# Patient Record
Sex: Male | Born: 1945 | Race: Black or African American | Hispanic: No | Marital: Married | Smoking: Never smoker
Health system: Southern US, Community
[De-identification: ages and names within clinical notes are randomized; demographics above are authoritative.]

## PROBLEM LIST (undated history)

## (undated) DIAGNOSIS — G1221 Amyotrophic lateral sclerosis: Secondary | ICD-10-CM

## (undated) DIAGNOSIS — Z789 Other specified health status: Secondary | ICD-10-CM

## (undated) HISTORY — PX: OTHER SURGICAL HISTORY: SHX169

---

## 1999-01-08 ENCOUNTER — Encounter: Payer: Self-pay | Admitting: Endocrinology

## 1999-01-08 ENCOUNTER — Ambulatory Visit (HOSPITAL_COMMUNITY): Admission: RE | Admit: 1999-01-08 | Discharge: 1999-01-08 | Payer: Self-pay | Admitting: Endocrinology

## 1999-09-03 ENCOUNTER — Other Ambulatory Visit: Admission: RE | Admit: 1999-09-03 | Discharge: 1999-09-03 | Payer: Self-pay | Admitting: *Deleted

## 1999-10-09 ENCOUNTER — Ambulatory Visit (HOSPITAL_COMMUNITY): Admission: RE | Admit: 1999-10-09 | Discharge: 1999-10-09 | Payer: Self-pay | Admitting: Gastroenterology

## 2000-08-14 ENCOUNTER — Encounter: Admission: RE | Admit: 2000-08-14 | Discharge: 2000-08-14 | Payer: Self-pay | Admitting: *Deleted

## 2000-08-14 ENCOUNTER — Encounter: Payer: Self-pay | Admitting: *Deleted

## 2011-05-14 HISTORY — PX: UPPER GI ENDOSCOPY: SHX6162

## 2012-02-18 DIAGNOSIS — R059 Cough, unspecified: Secondary | ICD-10-CM | POA: Diagnosis not present

## 2012-02-18 DIAGNOSIS — J069 Acute upper respiratory infection, unspecified: Secondary | ICD-10-CM | POA: Diagnosis not present

## 2012-02-18 DIAGNOSIS — R05 Cough: Secondary | ICD-10-CM | POA: Diagnosis not present

## 2012-03-11 DIAGNOSIS — R935 Abnormal findings on diagnostic imaging of other abdominal regions, including retroperitoneum: Secondary | ICD-10-CM | POA: Diagnosis not present

## 2012-03-11 DIAGNOSIS — Z800 Family history of malignant neoplasm of digestive organs: Secondary | ICD-10-CM | POA: Diagnosis not present

## 2012-04-23 DIAGNOSIS — Z0000 Encounter for general adult medical examination without abnormal findings: Secondary | ICD-10-CM | POA: Diagnosis not present

## 2012-04-23 DIAGNOSIS — Z125 Encounter for screening for malignant neoplasm of prostate: Secondary | ICD-10-CM | POA: Diagnosis not present

## 2012-04-23 DIAGNOSIS — Z23 Encounter for immunization: Secondary | ICD-10-CM | POA: Diagnosis not present

## 2012-04-23 DIAGNOSIS — E785 Hyperlipidemia, unspecified: Secondary | ICD-10-CM | POA: Diagnosis not present

## 2013-02-03 ENCOUNTER — Encounter (HOSPITAL_COMMUNITY): Payer: Self-pay | Admitting: Pharmacy Technician

## 2013-02-03 ENCOUNTER — Other Ambulatory Visit: Payer: Self-pay | Admitting: Gastroenterology

## 2013-02-05 ENCOUNTER — Encounter (HOSPITAL_COMMUNITY): Payer: Self-pay | Admitting: *Deleted

## 2013-02-16 ENCOUNTER — Encounter (HOSPITAL_COMMUNITY): Payer: Self-pay

## 2013-02-16 ENCOUNTER — Ambulatory Visit (HOSPITAL_COMMUNITY): Payer: Medicare Other | Admitting: Anesthesiology

## 2013-02-16 ENCOUNTER — Encounter (HOSPITAL_COMMUNITY)
Admission: RE | Disposition: A | Discharge status: Home / Self Care | Payer: Self-pay | Source: Ambulatory Visit | Attending: Gastroenterology

## 2013-02-16 ENCOUNTER — Ambulatory Visit (HOSPITAL_COMMUNITY)
Admission: RE | Admit: 2013-02-16 | Discharge: 2013-02-16 | Disposition: A | Discharge status: Home / Self Care | Payer: Medicare Other | Source: Ambulatory Visit | Attending: Gastroenterology | Admitting: Gastroenterology

## 2013-02-16 ENCOUNTER — Encounter (HOSPITAL_COMMUNITY): Payer: Self-pay | Admitting: Anesthesiology

## 2013-02-16 DIAGNOSIS — Z86010 Personal history of colonic polyps: Secondary | ICD-10-CM | POA: Diagnosis not present

## 2013-02-16 DIAGNOSIS — D126 Benign neoplasm of colon, unspecified: Secondary | ICD-10-CM | POA: Insufficient documentation

## 2013-02-16 DIAGNOSIS — E7800 Pure hypercholesterolemia, unspecified: Secondary | ICD-10-CM | POA: Diagnosis not present

## 2013-02-16 DIAGNOSIS — N400 Enlarged prostate without lower urinary tract symptoms: Secondary | ICD-10-CM | POA: Insufficient documentation

## 2013-02-16 DIAGNOSIS — Z1211 Encounter for screening for malignant neoplasm of colon: Secondary | ICD-10-CM | POA: Diagnosis not present

## 2013-02-16 DIAGNOSIS — K589 Irritable bowel syndrome without diarrhea: Secondary | ICD-10-CM | POA: Diagnosis not present

## 2013-02-16 HISTORY — PX: COLONOSCOPY WITH PROPOFOL: SHX5780

## 2013-02-16 HISTORY — DX: Other specified health status: Z78.9

## 2013-02-16 SURGERY — COLONOSCOPY WITH PROPOFOL
Anesthesia: Monitor Anesthesia Care

## 2013-02-16 MED ORDER — FENTANYL CITRATE 0.05 MG/ML IJ SOLN
INTRAMUSCULAR | Status: DC | PRN
  Administered 2013-02-16 (×2): 50 ug via INTRAVENOUS

## 2013-02-16 MED ORDER — MIDAZOLAM HCL 5 MG/5ML IJ SOLN
INTRAMUSCULAR | Status: DC | PRN
  Administered 2013-02-16 (×2): 1 mg via INTRAVENOUS

## 2013-02-16 MED ORDER — SODIUM CHLORIDE 0.9 % IV SOLN: INTRAVENOUS | Status: DC

## 2013-02-16 MED ORDER — LACTATED RINGERS IV SOLN
INTRAVENOUS | Status: DC
  Administered 2013-02-16: 11:00:00 via INTRAVENOUS

## 2013-02-16 MED ORDER — KETAMINE HCL 10 MG/ML IJ SOLN
INTRAMUSCULAR | Status: DC | PRN
  Administered 2013-02-16: 10 mg via INTRAVENOUS

## 2013-02-16 MED ORDER — PROPOFOL INFUSION 10 MG/ML OPTIME
INTRAVENOUS | Status: DC | PRN
  Administered 2013-02-16: 140 ug/kg/min via INTRAVENOUS

## 2013-02-16 SURGICAL SUPPLY — 21 items

## 2013-02-16 NOTE — Op Note (Signed)
Procedure: Surveillance colonoscopy  Endoscopist: Danise Edge  Premedication: Propofol administered by anesthesia   Procedure: The patient was placed in the left lateral decubitus position. Anal inspection and digital rectal exam were normal. The Pentax pediatric colonoscope was introduced into the rectum and easily advanced to the cecum. A normal-appearing ileocecal valve and appendiceal orifice were identified. Colonic preparation for the exam today was good.  Rectum. Normal. Retroflex view of the distal rectum normal.  Sigmoid colon and descending colon. Normal.  Splenic flexure. Normal.  Transverse colon. From the mid transverse colon a 5 mm sessile polyp was removed with the cold snare and submitted for pathological interpretation.  Hepatic flexure. Normal.  Ascending colon. Normal.  Cecum and ileocecal valve. Normal.  Assessment  #1. From the mid transverse colon a 5 mm sessile polyp was removed with the cold snare  #2. Otherwise normal surveillance proctocolonoscopy to the cecum  Recommendation: Schedule surveillance colonoscopy in 5 years

## 2013-02-16 NOTE — Transfer of Care (Signed)
Immediate Anesthesia Transfer of Care Note  Patient: Albert Leonard  Procedure(s) Performed: Procedure(s): COLONOSCOPY WITH PROPOFOL (N/A)  Patient Location: PACU  Anesthesia Type:MAC  Level of Consciousness: sedated  Airway & Oxygen Therapy: Patient Spontanous Breathing and Patient connected to face mask oxygen  Post-op Assessment: Report given to PACU RN and Post -op Vital signs reviewed and stable  Post vital signs: Reviewed and stable  Complications: No apparent anesthesia complications

## 2013-02-16 NOTE — Preoperative (Signed)
Beta Blockers   Reason not to administer Beta Blockers:Not Applicable 

## 2013-02-16 NOTE — Anesthesia Postprocedure Evaluation (Signed)
Anesthesia Post Note  Patient: Albert Leonard  Procedure(s) Performed: Procedure(s) (LRB): COLONOSCOPY WITH PROPOFOL (N/A)  Anesthesia type: MAC  Patient location: PACU  Post pain: Pain level controlled  Post assessment: Post-op Vital signs reviewed  Last Vitals: BP 108/74  Temp(Src) 36.5 C (Oral)  Resp 9  Ht 6\' 2"  (1.88 m)  Wt 198 lb (89.812 kg)  BMI 25.41 kg/m2  SpO2 98%  Post vital signs: Reviewed  Level of consciousness: awake  Complications: No apparent anesthesia complications

## 2013-02-16 NOTE — H&P (Signed)
  Procedure: Surveillance colonoscopy. Adenomatous colon polyps removed colonoscopically in the past. Normal surveillance colonoscopy in August 2009.  History: The patient is a 67 year old male born 06/02/1945. The patient is scheduled to undergo a surveillance colonoscopy with polypectomy to prevent colon cancer.  Past medical history: Mastectomy. Irritable bowel syndrome. Benign prostatic hypertrophy. Hypercholesterolemia.  Medication allergies: None  Exam: The patient is alert and lying comfortably on the endoscopy stretcher. Abdomen is soft and nontender to palpation. Lungs are clear to auscultation. Cardiac exam reveals a regular rhythm.  Plan: Proceed with surveillance colonoscopy.

## 2013-02-16 NOTE — Anesthesia Preprocedure Evaluation (Addendum)
Anesthesia Evaluation  Patient identified by MRN, date of birth, ID band Patient awake    Reviewed: Allergy & Precautions, H&P , NPO status , Patient's Chart, lab work & pertinent test results  Airway Mallampati: II TM Distance: >3 FB Neck ROM: Full    Dental  (+) Dental Advisory Given   Pulmonary neg pulmonary ROS,  breath sounds clear to auscultation        Cardiovascular negative cardio ROS  Rhythm:Regular Rate:Normal     Neuro/Psych negative neurological ROS  negative psych ROS   GI/Hepatic negative GI ROS, Neg liver ROS,   Endo/Other  negative endocrine ROS  Renal/GU negative Renal ROS     Musculoskeletal negative musculoskeletal ROS (+)   Abdominal   Peds  Hematology negative hematology ROS (+)   Anesthesia Other Findings   Reproductive/Obstetrics                          Anesthesia Physical Anesthesia Plan  ASA: II  Anesthesia Plan: MAC   Post-op Pain Management:    Induction: Intravenous  Airway Management Planned:   Additional Equipment:   Intra-op Plan:   Post-operative Plan:   Informed Consent: I have reviewed the patients History and Physical, chart, labs and discussed the procedure including the risks, benefits and alternatives for the proposed anesthesia with the patient or authorized representative who has indicated his/her understanding and acceptance.   Dental advisory given  Plan Discussed with: CRNA  Anesthesia Plan Comments:         Anesthesia Quick Evaluation

## 2013-02-17 ENCOUNTER — Encounter (HOSPITAL_COMMUNITY): Payer: Self-pay | Admitting: Gastroenterology

## 2013-04-26 DIAGNOSIS — Z0000 Encounter for general adult medical examination without abnormal findings: Secondary | ICD-10-CM | POA: Diagnosis not present

## 2013-04-26 DIAGNOSIS — E785 Hyperlipidemia, unspecified: Secondary | ICD-10-CM | POA: Diagnosis not present

## 2013-04-26 DIAGNOSIS — N400 Enlarged prostate without lower urinary tract symptoms: Secondary | ICD-10-CM | POA: Diagnosis not present

## 2013-06-03 DIAGNOSIS — N139 Obstructive and reflux uropathy, unspecified: Secondary | ICD-10-CM | POA: Diagnosis not present

## 2013-06-03 DIAGNOSIS — N401 Enlarged prostate with lower urinary tract symptoms: Secondary | ICD-10-CM | POA: Diagnosis not present

## 2013-06-08 DIAGNOSIS — H2510 Age-related nuclear cataract, unspecified eye: Secondary | ICD-10-CM | POA: Diagnosis not present

## 2013-06-08 DIAGNOSIS — H04129 Dry eye syndrome of unspecified lacrimal gland: Secondary | ICD-10-CM | POA: Diagnosis not present

## 2013-08-11 DIAGNOSIS — K2990 Gastroduodenitis, unspecified, without bleeding: Secondary | ICD-10-CM | POA: Diagnosis not present

## 2013-08-11 DIAGNOSIS — K2970 Gastritis, unspecified, without bleeding: Secondary | ICD-10-CM | POA: Diagnosis not present

## 2013-08-11 DIAGNOSIS — R1013 Epigastric pain: Secondary | ICD-10-CM | POA: Diagnosis not present

## 2013-08-11 DIAGNOSIS — Z800 Family history of malignant neoplasm of digestive organs: Secondary | ICD-10-CM | POA: Diagnosis not present

## 2013-08-11 DIAGNOSIS — K861 Other chronic pancreatitis: Secondary | ICD-10-CM | POA: Diagnosis not present

## 2013-08-11 DIAGNOSIS — K3189 Other diseases of stomach and duodenum: Secondary | ICD-10-CM | POA: Diagnosis not present

## 2013-08-11 DIAGNOSIS — Z5181 Encounter for therapeutic drug level monitoring: Secondary | ICD-10-CM | POA: Diagnosis not present

## 2013-08-11 DIAGNOSIS — Z129 Encounter for screening for malignant neoplasm, site unspecified: Secondary | ICD-10-CM | POA: Diagnosis not present

## 2013-09-03 DIAGNOSIS — S61209A Unspecified open wound of unspecified finger without damage to nail, initial encounter: Secondary | ICD-10-CM | POA: Diagnosis not present

## 2013-09-03 DIAGNOSIS — M79609 Pain in unspecified limb: Secondary | ICD-10-CM | POA: Diagnosis not present

## 2013-09-09 DIAGNOSIS — M79609 Pain in unspecified limb: Secondary | ICD-10-CM | POA: Diagnosis not present

## 2013-11-29 ENCOUNTER — Other Ambulatory Visit: Payer: Self-pay | Admitting: Internal Medicine

## 2013-11-29 DIAGNOSIS — R259 Unspecified abnormal involuntary movements: Secondary | ICD-10-CM | POA: Diagnosis not present

## 2013-11-29 DIAGNOSIS — R200 Anesthesia of skin: Secondary | ICD-10-CM

## 2013-11-29 DIAGNOSIS — R29898 Other symptoms and signs involving the musculoskeletal system: Secondary | ICD-10-CM

## 2013-11-29 DIAGNOSIS — M6281 Muscle weakness (generalized): Secondary | ICD-10-CM | POA: Diagnosis not present

## 2013-11-29 DIAGNOSIS — R209 Unspecified disturbances of skin sensation: Secondary | ICD-10-CM | POA: Diagnosis not present

## 2013-11-30 ENCOUNTER — Ambulatory Visit
Admission: RE | Admit: 2013-11-30 | Discharge: 2013-11-30 | Disposition: A | Discharge status: Home / Self Care | Payer: Medicare Other | Source: Ambulatory Visit | Attending: Internal Medicine | Admitting: Internal Medicine

## 2013-11-30 DIAGNOSIS — M47812 Spondylosis without myelopathy or radiculopathy, cervical region: Secondary | ICD-10-CM | POA: Diagnosis not present

## 2013-11-30 DIAGNOSIS — R29898 Other symptoms and signs involving the musculoskeletal system: Secondary | ICD-10-CM

## 2013-11-30 DIAGNOSIS — R200 Anesthesia of skin: Secondary | ICD-10-CM

## 2013-12-07 DIAGNOSIS — G609 Hereditary and idiopathic neuropathy, unspecified: Secondary | ICD-10-CM | POA: Diagnosis not present

## 2013-12-07 DIAGNOSIS — G5600 Carpal tunnel syndrome, unspecified upper limb: Secondary | ICD-10-CM | POA: Diagnosis not present

## 2013-12-09 DIAGNOSIS — M4802 Spinal stenosis, cervical region: Secondary | ICD-10-CM | POA: Diagnosis not present

## 2013-12-09 DIAGNOSIS — M542 Cervicalgia: Secondary | ICD-10-CM | POA: Diagnosis not present

## 2013-12-09 DIAGNOSIS — Z6825 Body mass index (BMI) 25.0-25.9, adult: Secondary | ICD-10-CM | POA: Diagnosis not present

## 2013-12-15 DIAGNOSIS — M4802 Spinal stenosis, cervical region: Secondary | ICD-10-CM | POA: Diagnosis not present

## 2013-12-15 DIAGNOSIS — R293 Abnormal posture: Secondary | ICD-10-CM | POA: Diagnosis not present

## 2013-12-15 DIAGNOSIS — M2550 Pain in unspecified joint: Secondary | ICD-10-CM | POA: Diagnosis not present

## 2013-12-15 DIAGNOSIS — M6281 Muscle weakness (generalized): Secondary | ICD-10-CM | POA: Diagnosis not present

## 2013-12-16 DIAGNOSIS — M4802 Spinal stenosis, cervical region: Secondary | ICD-10-CM | POA: Diagnosis not present

## 2013-12-16 DIAGNOSIS — M6281 Muscle weakness (generalized): Secondary | ICD-10-CM | POA: Diagnosis not present

## 2013-12-16 DIAGNOSIS — M2550 Pain in unspecified joint: Secondary | ICD-10-CM | POA: Diagnosis not present

## 2013-12-16 DIAGNOSIS — R293 Abnormal posture: Secondary | ICD-10-CM | POA: Diagnosis not present

## 2013-12-20 DIAGNOSIS — M4802 Spinal stenosis, cervical region: Secondary | ICD-10-CM | POA: Diagnosis not present

## 2013-12-20 DIAGNOSIS — M2550 Pain in unspecified joint: Secondary | ICD-10-CM | POA: Diagnosis not present

## 2013-12-20 DIAGNOSIS — R293 Abnormal posture: Secondary | ICD-10-CM | POA: Diagnosis not present

## 2013-12-20 DIAGNOSIS — M6281 Muscle weakness (generalized): Secondary | ICD-10-CM | POA: Diagnosis not present

## 2013-12-22 DIAGNOSIS — M2550 Pain in unspecified joint: Secondary | ICD-10-CM | POA: Diagnosis not present

## 2013-12-22 DIAGNOSIS — R293 Abnormal posture: Secondary | ICD-10-CM | POA: Diagnosis not present

## 2013-12-22 DIAGNOSIS — M6281 Muscle weakness (generalized): Secondary | ICD-10-CM | POA: Diagnosis not present

## 2013-12-22 DIAGNOSIS — M4802 Spinal stenosis, cervical region: Secondary | ICD-10-CM | POA: Diagnosis not present

## 2013-12-24 DIAGNOSIS — M2550 Pain in unspecified joint: Secondary | ICD-10-CM | POA: Diagnosis not present

## 2013-12-24 DIAGNOSIS — M4802 Spinal stenosis, cervical region: Secondary | ICD-10-CM | POA: Diagnosis not present

## 2013-12-24 DIAGNOSIS — M6281 Muscle weakness (generalized): Secondary | ICD-10-CM | POA: Diagnosis not present

## 2013-12-24 DIAGNOSIS — R293 Abnormal posture: Secondary | ICD-10-CM | POA: Diagnosis not present

## 2013-12-27 DIAGNOSIS — M6281 Muscle weakness (generalized): Secondary | ICD-10-CM | POA: Diagnosis not present

## 2013-12-27 DIAGNOSIS — M4802 Spinal stenosis, cervical region: Secondary | ICD-10-CM | POA: Diagnosis not present

## 2013-12-27 DIAGNOSIS — R293 Abnormal posture: Secondary | ICD-10-CM | POA: Diagnosis not present

## 2013-12-27 DIAGNOSIS — M2550 Pain in unspecified joint: Secondary | ICD-10-CM | POA: Diagnosis not present

## 2013-12-29 DIAGNOSIS — M6281 Muscle weakness (generalized): Secondary | ICD-10-CM | POA: Diagnosis not present

## 2013-12-29 DIAGNOSIS — M4802 Spinal stenosis, cervical region: Secondary | ICD-10-CM | POA: Diagnosis not present

## 2013-12-29 DIAGNOSIS — R293 Abnormal posture: Secondary | ICD-10-CM | POA: Diagnosis not present

## 2013-12-29 DIAGNOSIS — M2550 Pain in unspecified joint: Secondary | ICD-10-CM | POA: Diagnosis not present

## 2013-12-31 DIAGNOSIS — M4802 Spinal stenosis, cervical region: Secondary | ICD-10-CM | POA: Diagnosis not present

## 2013-12-31 DIAGNOSIS — R293 Abnormal posture: Secondary | ICD-10-CM | POA: Diagnosis not present

## 2013-12-31 DIAGNOSIS — M6281 Muscle weakness (generalized): Secondary | ICD-10-CM | POA: Diagnosis not present

## 2013-12-31 DIAGNOSIS — M2550 Pain in unspecified joint: Secondary | ICD-10-CM | POA: Diagnosis not present

## 2014-01-03 DIAGNOSIS — M4802 Spinal stenosis, cervical region: Secondary | ICD-10-CM | POA: Diagnosis not present

## 2014-01-03 DIAGNOSIS — M4712 Other spondylosis with myelopathy, cervical region: Secondary | ICD-10-CM | POA: Diagnosis not present

## 2014-01-03 DIAGNOSIS — M6281 Muscle weakness (generalized): Secondary | ICD-10-CM | POA: Diagnosis not present

## 2014-01-03 DIAGNOSIS — M2550 Pain in unspecified joint: Secondary | ICD-10-CM | POA: Diagnosis not present

## 2014-01-03 DIAGNOSIS — R293 Abnormal posture: Secondary | ICD-10-CM | POA: Diagnosis not present

## 2014-01-05 DIAGNOSIS — R293 Abnormal posture: Secondary | ICD-10-CM | POA: Diagnosis not present

## 2014-01-05 DIAGNOSIS — M2550 Pain in unspecified joint: Secondary | ICD-10-CM | POA: Diagnosis not present

## 2014-01-05 DIAGNOSIS — M4802 Spinal stenosis, cervical region: Secondary | ICD-10-CM | POA: Diagnosis not present

## 2014-01-05 DIAGNOSIS — M6281 Muscle weakness (generalized): Secondary | ICD-10-CM | POA: Diagnosis not present

## 2014-01-07 DIAGNOSIS — R293 Abnormal posture: Secondary | ICD-10-CM | POA: Diagnosis not present

## 2014-01-07 DIAGNOSIS — M2550 Pain in unspecified joint: Secondary | ICD-10-CM | POA: Diagnosis not present

## 2014-01-07 DIAGNOSIS — M6281 Muscle weakness (generalized): Secondary | ICD-10-CM | POA: Diagnosis not present

## 2014-01-07 DIAGNOSIS — M4802 Spinal stenosis, cervical region: Secondary | ICD-10-CM | POA: Diagnosis not present

## 2014-01-10 DIAGNOSIS — M2550 Pain in unspecified joint: Secondary | ICD-10-CM | POA: Diagnosis not present

## 2014-01-10 DIAGNOSIS — R293 Abnormal posture: Secondary | ICD-10-CM | POA: Diagnosis not present

## 2014-01-10 DIAGNOSIS — M6281 Muscle weakness (generalized): Secondary | ICD-10-CM | POA: Diagnosis not present

## 2014-01-10 DIAGNOSIS — M4802 Spinal stenosis, cervical region: Secondary | ICD-10-CM | POA: Diagnosis not present

## 2014-01-11 DIAGNOSIS — R293 Abnormal posture: Secondary | ICD-10-CM | POA: Diagnosis not present

## 2014-01-11 DIAGNOSIS — M6281 Muscle weakness (generalized): Secondary | ICD-10-CM | POA: Diagnosis not present

## 2014-01-11 DIAGNOSIS — M2550 Pain in unspecified joint: Secondary | ICD-10-CM | POA: Diagnosis not present

## 2014-01-11 DIAGNOSIS — M4802 Spinal stenosis, cervical region: Secondary | ICD-10-CM | POA: Diagnosis not present

## 2014-01-19 DIAGNOSIS — M2550 Pain in unspecified joint: Secondary | ICD-10-CM | POA: Diagnosis not present

## 2014-01-19 DIAGNOSIS — M6281 Muscle weakness (generalized): Secondary | ICD-10-CM | POA: Diagnosis not present

## 2014-01-19 DIAGNOSIS — R293 Abnormal posture: Secondary | ICD-10-CM | POA: Diagnosis not present

## 2014-01-19 DIAGNOSIS — M4802 Spinal stenosis, cervical region: Secondary | ICD-10-CM | POA: Diagnosis not present

## 2014-01-21 DIAGNOSIS — M6281 Muscle weakness (generalized): Secondary | ICD-10-CM | POA: Diagnosis not present

## 2014-01-21 DIAGNOSIS — M4802 Spinal stenosis, cervical region: Secondary | ICD-10-CM | POA: Diagnosis not present

## 2014-01-21 DIAGNOSIS — R293 Abnormal posture: Secondary | ICD-10-CM | POA: Diagnosis not present

## 2014-01-21 DIAGNOSIS — M2550 Pain in unspecified joint: Secondary | ICD-10-CM | POA: Diagnosis not present

## 2014-01-24 DIAGNOSIS — M4802 Spinal stenosis, cervical region: Secondary | ICD-10-CM | POA: Diagnosis not present

## 2014-01-24 DIAGNOSIS — G5600 Carpal tunnel syndrome, unspecified upper limb: Secondary | ICD-10-CM | POA: Diagnosis not present

## 2014-01-24 DIAGNOSIS — M47812 Spondylosis without myelopathy or radiculopathy, cervical region: Secondary | ICD-10-CM | POA: Diagnosis not present

## 2014-01-24 DIAGNOSIS — M5030 Other cervical disc degeneration, unspecified cervical region: Secondary | ICD-10-CM | POA: Diagnosis not present

## 2014-01-26 DIAGNOSIS — M2550 Pain in unspecified joint: Secondary | ICD-10-CM | POA: Diagnosis not present

## 2014-01-26 DIAGNOSIS — M6281 Muscle weakness (generalized): Secondary | ICD-10-CM | POA: Diagnosis not present

## 2014-01-26 DIAGNOSIS — R293 Abnormal posture: Secondary | ICD-10-CM | POA: Diagnosis not present

## 2014-01-26 DIAGNOSIS — M4802 Spinal stenosis, cervical region: Secondary | ICD-10-CM | POA: Diagnosis not present

## 2014-01-28 DIAGNOSIS — M6281 Muscle weakness (generalized): Secondary | ICD-10-CM | POA: Diagnosis not present

## 2014-01-28 DIAGNOSIS — M2550 Pain in unspecified joint: Secondary | ICD-10-CM | POA: Diagnosis not present

## 2014-01-28 DIAGNOSIS — R293 Abnormal posture: Secondary | ICD-10-CM | POA: Diagnosis not present

## 2014-01-28 DIAGNOSIS — M4802 Spinal stenosis, cervical region: Secondary | ICD-10-CM | POA: Diagnosis not present

## 2014-01-31 DIAGNOSIS — M4802 Spinal stenosis, cervical region: Secondary | ICD-10-CM | POA: Diagnosis not present

## 2014-01-31 DIAGNOSIS — M2550 Pain in unspecified joint: Secondary | ICD-10-CM | POA: Diagnosis not present

## 2014-01-31 DIAGNOSIS — R293 Abnormal posture: Secondary | ICD-10-CM | POA: Diagnosis not present

## 2014-01-31 DIAGNOSIS — M6281 Muscle weakness (generalized): Secondary | ICD-10-CM | POA: Diagnosis not present

## 2014-03-07 DIAGNOSIS — M47812 Spondylosis without myelopathy or radiculopathy, cervical region: Secondary | ICD-10-CM | POA: Diagnosis not present

## 2014-03-07 DIAGNOSIS — G5602 Carpal tunnel syndrome, left upper limb: Secondary | ICD-10-CM | POA: Diagnosis not present

## 2014-03-07 DIAGNOSIS — E780 Pure hypercholesterolemia: Secondary | ICD-10-CM | POA: Diagnosis not present

## 2014-03-07 DIAGNOSIS — G5601 Carpal tunnel syndrome, right upper limb: Secondary | ICD-10-CM | POA: Diagnosis not present

## 2014-03-07 DIAGNOSIS — M47892 Other spondylosis, cervical region: Secondary | ICD-10-CM | POA: Diagnosis not present

## 2014-03-07 DIAGNOSIS — R531 Weakness: Secondary | ICD-10-CM | POA: Diagnosis not present

## 2014-03-07 DIAGNOSIS — R29898 Other symptoms and signs involving the musculoskeletal system: Secondary | ICD-10-CM | POA: Diagnosis not present

## 2014-03-07 DIAGNOSIS — M6259 Muscle wasting and atrophy, not elsewhere classified, multiple sites: Secondary | ICD-10-CM | POA: Diagnosis not present

## 2014-03-07 DIAGNOSIS — M62529 Muscle wasting and atrophy, not elsewhere classified, unspecified upper arm: Secondary | ICD-10-CM | POA: Diagnosis not present

## 2014-04-04 DIAGNOSIS — G603 Idiopathic progressive neuropathy: Secondary | ICD-10-CM | POA: Diagnosis not present

## 2014-04-04 DIAGNOSIS — R531 Weakness: Secondary | ICD-10-CM | POA: Diagnosis not present

## 2014-04-04 DIAGNOSIS — M479 Spondylosis, unspecified: Secondary | ICD-10-CM | POA: Diagnosis not present

## 2014-04-04 DIAGNOSIS — M47812 Spondylosis without myelopathy or radiculopathy, cervical region: Secondary | ICD-10-CM | POA: Diagnosis not present

## 2014-04-14 DIAGNOSIS — G1220 Motor neuron disease, unspecified: Secondary | ICD-10-CM | POA: Diagnosis not present

## 2014-04-14 DIAGNOSIS — G5601 Carpal tunnel syndrome, right upper limb: Secondary | ICD-10-CM | POA: Diagnosis not present

## 2014-04-14 DIAGNOSIS — R531 Weakness: Secondary | ICD-10-CM | POA: Diagnosis not present

## 2014-05-02 DIAGNOSIS — Z1389 Encounter for screening for other disorder: Secondary | ICD-10-CM | POA: Diagnosis not present

## 2014-05-02 DIAGNOSIS — Z125 Encounter for screening for malignant neoplasm of prostate: Secondary | ICD-10-CM | POA: Diagnosis not present

## 2014-05-02 DIAGNOSIS — M6281 Muscle weakness (generalized): Secondary | ICD-10-CM | POA: Diagnosis not present

## 2014-05-02 DIAGNOSIS — Z0001 Encounter for general adult medical examination with abnormal findings: Secondary | ICD-10-CM | POA: Diagnosis not present

## 2014-05-02 DIAGNOSIS — E785 Hyperlipidemia, unspecified: Secondary | ICD-10-CM | POA: Diagnosis not present

## 2014-05-19 DIAGNOSIS — G1221 Amyotrophic lateral sclerosis: Secondary | ICD-10-CM | POA: Diagnosis not present

## 2014-05-25 DIAGNOSIS — G1221 Amyotrophic lateral sclerosis: Secondary | ICD-10-CM | POA: Diagnosis not present

## 2014-05-25 DIAGNOSIS — Z5189 Encounter for other specified aftercare: Secondary | ICD-10-CM | POA: Diagnosis not present

## 2014-05-25 DIAGNOSIS — R531 Weakness: Secondary | ICD-10-CM | POA: Diagnosis not present

## 2014-06-13 DIAGNOSIS — Z79899 Other long term (current) drug therapy: Secondary | ICD-10-CM | POA: Diagnosis not present

## 2014-08-18 DIAGNOSIS — Z125 Encounter for screening for malignant neoplasm of prostate: Secondary | ICD-10-CM | POA: Diagnosis not present

## 2014-08-22 DIAGNOSIS — N401 Enlarged prostate with lower urinary tract symptoms: Secondary | ICD-10-CM | POA: Diagnosis not present

## 2014-08-22 DIAGNOSIS — N138 Other obstructive and reflux uropathy: Secondary | ICD-10-CM | POA: Diagnosis not present

## 2014-08-22 DIAGNOSIS — Z125 Encounter for screening for malignant neoplasm of prostate: Secondary | ICD-10-CM | POA: Diagnosis not present

## 2014-08-31 DIAGNOSIS — G1221 Amyotrophic lateral sclerosis: Secondary | ICD-10-CM | POA: Diagnosis not present

## 2014-09-07 DIAGNOSIS — Z800 Family history of malignant neoplasm of digestive organs: Secondary | ICD-10-CM | POA: Diagnosis not present

## 2014-12-14 DIAGNOSIS — G1221 Amyotrophic lateral sclerosis: Secondary | ICD-10-CM | POA: Diagnosis not present

## 2014-12-14 DIAGNOSIS — Z79899 Other long term (current) drug therapy: Secondary | ICD-10-CM | POA: Diagnosis not present

## 2015-03-10 DIAGNOSIS — Z79899 Other long term (current) drug therapy: Secondary | ICD-10-CM | POA: Diagnosis not present

## 2015-03-29 DIAGNOSIS — G1221 Amyotrophic lateral sclerosis: Secondary | ICD-10-CM | POA: Diagnosis not present

## 2015-03-29 DIAGNOSIS — R531 Weakness: Secondary | ICD-10-CM | POA: Diagnosis not present

## 2015-06-08 DIAGNOSIS — G1221 Amyotrophic lateral sclerosis: Secondary | ICD-10-CM | POA: Diagnosis not present

## 2015-06-08 DIAGNOSIS — E785 Hyperlipidemia, unspecified: Secondary | ICD-10-CM | POA: Diagnosis not present

## 2015-06-08 DIAGNOSIS — Z1389 Encounter for screening for other disorder: Secondary | ICD-10-CM | POA: Diagnosis not present

## 2015-06-08 DIAGNOSIS — Z0001 Encounter for general adult medical examination with abnormal findings: Secondary | ICD-10-CM | POA: Diagnosis not present

## 2015-06-21 DIAGNOSIS — G1221 Amyotrophic lateral sclerosis: Secondary | ICD-10-CM | POA: Diagnosis not present

## 2015-06-21 DIAGNOSIS — R253 Fasciculation: Secondary | ICD-10-CM | POA: Diagnosis not present

## 2015-06-21 DIAGNOSIS — R94131 Abnormal electromyogram [EMG]: Secondary | ICD-10-CM | POA: Diagnosis not present

## 2015-06-30 DIAGNOSIS — M6281 Muscle weakness (generalized): Secondary | ICD-10-CM | POA: Diagnosis not present

## 2015-07-24 DIAGNOSIS — R509 Fever, unspecified: Secondary | ICD-10-CM | POA: Diagnosis not present

## 2015-07-24 DIAGNOSIS — J189 Pneumonia, unspecified organism: Secondary | ICD-10-CM | POA: Diagnosis not present

## 2015-07-24 DIAGNOSIS — R05 Cough: Secondary | ICD-10-CM | POA: Diagnosis not present

## 2015-08-05 ENCOUNTER — Emergency Department (HOSPITAL_COMMUNITY)
Admission: EM | Admit: 2015-08-05 | Discharge: 2015-08-05 | Disposition: A | Discharge status: Home / Self Care | Payer: Medicare Other | Attending: Emergency Medicine | Admitting: Emergency Medicine

## 2015-08-05 ENCOUNTER — Encounter (HOSPITAL_COMMUNITY): Payer: Self-pay

## 2015-08-05 ENCOUNTER — Emergency Department (HOSPITAL_COMMUNITY): Payer: Medicare Other

## 2015-08-05 DIAGNOSIS — R1084 Generalized abdominal pain: Secondary | ICD-10-CM | POA: Diagnosis present

## 2015-08-05 DIAGNOSIS — Z7982 Long term (current) use of aspirin: Secondary | ICD-10-CM | POA: Insufficient documentation

## 2015-08-05 DIAGNOSIS — Z79899 Other long term (current) drug therapy: Secondary | ICD-10-CM | POA: Insufficient documentation

## 2015-08-05 DIAGNOSIS — R109 Unspecified abdominal pain: Secondary | ICD-10-CM | POA: Diagnosis not present

## 2015-08-05 DIAGNOSIS — Z87891 Personal history of nicotine dependence: Secondary | ICD-10-CM | POA: Insufficient documentation

## 2015-08-05 DIAGNOSIS — R197 Diarrhea, unspecified: Secondary | ICD-10-CM

## 2015-08-05 DIAGNOSIS — R112 Nausea with vomiting, unspecified: Secondary | ICD-10-CM

## 2015-08-05 DIAGNOSIS — K529 Noninfective gastroenteritis and colitis, unspecified: Secondary | ICD-10-CM | POA: Insufficient documentation

## 2015-08-05 DIAGNOSIS — Z8669 Personal history of other diseases of the nervous system and sense organs: Secondary | ICD-10-CM | POA: Diagnosis not present

## 2015-08-05 HISTORY — DX: Amyotrophic lateral sclerosis: G12.21

## 2015-08-05 LAB — URINE MICROSCOPIC-ADD ON
RBC / HPF: NONE SEEN RBC/hpf (ref 0–5)
SQUAMOUS EPITHELIAL / LPF: NONE SEEN

## 2015-08-05 LAB — LIPASE, BLOOD: Lipase: 33 U/L (ref 11–51)

## 2015-08-05 LAB — CBC
HCT: 44.4 % (ref 39.0–52.0)
Hemoglobin: 15.4 g/dL (ref 13.0–17.0)
MCH: 30.8 pg (ref 26.0–34.0)
MCHC: 34.7 g/dL (ref 30.0–36.0)
MCV: 88.8 fL (ref 78.0–100.0)
PLATELETS: 205 10*3/uL (ref 150–400)
RBC: 5 MIL/uL (ref 4.22–5.81)
RDW: 12.8 % (ref 11.5–15.5)
WBC: 12 10*3/uL — AB (ref 4.0–10.5)

## 2015-08-05 LAB — COMPREHENSIVE METABOLIC PANEL
ALT: 63 U/L (ref 17–63)
AST: 62 U/L — AB (ref 15–41)
Albumin: 4.6 g/dL (ref 3.5–5.0)
Alkaline Phosphatase: 50 U/L (ref 38–126)
Anion gap: 11 (ref 5–15)
BILIRUBIN TOTAL: 1.5 mg/dL — AB (ref 0.3–1.2)
BUN: 20 mg/dL (ref 6–20)
CO2: 24 mmol/L (ref 22–32)
CREATININE: 1.01 mg/dL (ref 0.61–1.24)
Calcium: 9.4 mg/dL (ref 8.9–10.3)
Chloride: 105 mmol/L (ref 101–111)
GFR calc Af Amer: >60 mL/min (ref >60)
GFR calc non Af Amer: >60 mL/min (ref >60)
Glucose, Bld: 155 mg/dL — ABNORMAL HIGH (ref 65–99)
Potassium: 4.8 mmol/L (ref 3.5–5.1)
Sodium: 140 mmol/L (ref 135–145)
TOTAL PROTEIN: 9 g/dL — AB (ref 6.5–8.1)

## 2015-08-05 LAB — URINALYSIS, ROUTINE W REFLEX MICROSCOPIC
BILIRUBIN URINE: NEGATIVE
Glucose, UA: NEGATIVE mg/dL
Hgb urine dipstick: NEGATIVE
KETONES UR: NEGATIVE mg/dL
Leukocytes, UA: NEGATIVE
NITRITE: NEGATIVE
PROTEIN: 30 mg/dL — AB
Specific Gravity, Urine: 1.025 (ref 1.005–1.030)
pH: 5 (ref 5.0–8.0)

## 2015-08-05 LAB — I-STAT CHEM 8, ED
BUN: 23 mg/dL — AB (ref 6–20)
CREATININE: 1 mg/dL (ref 0.61–1.24)
Calcium, Ion: 1.07 mmol/L — ABNORMAL LOW (ref 1.13–1.30)
Chloride: 104 mmol/L (ref 101–111)
Glucose, Bld: 152 mg/dL — ABNORMAL HIGH (ref 65–99)
HCT: 51 % (ref 39.0–52.0)
Hemoglobin: 17.3 g/dL — ABNORMAL HIGH (ref 13.0–17.0)
POTASSIUM: 4.6 mmol/L (ref 3.5–5.1)
Sodium: 138 mmol/L (ref 135–145)
TCO2: 22 mmol/L (ref 0–100)

## 2015-08-05 MED ORDER — ONDANSETRON HCL 4 MG/2ML IJ SOLN
4.0000 mg | Freq: Once | INTRAMUSCULAR | Status: AC
  Administered 2015-08-05: 4 mg via INTRAVENOUS
  Filled 2015-08-05: qty 2

## 2015-08-05 MED ORDER — IOPAMIDOL (ISOVUE-300) INJECTION 61%
100.0000 mL | Freq: Once | INTRAVENOUS | Status: AC | PRN
  Administered 2015-08-05: 100 mL via INTRAVENOUS

## 2015-08-05 MED ORDER — IOHEXOL 300 MG/ML  SOLN
25.0000 mL | Freq: Once | INTRAMUSCULAR | Status: AC | PRN
Start: 2015-08-05 — End: 2015-08-05
  Administered 2015-08-05: 25 mL via ORAL

## 2015-08-05 MED ORDER — SODIUM CHLORIDE 0.9 % IV BOLUS (SEPSIS)
1000.0000 mL | Freq: Once | INTRAVENOUS | Status: AC
  Administered 2015-08-05: 1000 mL via INTRAVENOUS

## 2015-08-05 MED ORDER — MORPHINE SULFATE (PF) 4 MG/ML IV SOLN
4.0000 mg | Freq: Once | INTRAVENOUS | Status: AC
  Administered 2015-08-05: 4 mg via INTRAVENOUS
  Filled 2015-08-05: qty 1

## 2015-08-05 NOTE — ED Notes (Signed)
Pt with diarrhea, nausea/vomiting since last night.  Became weak at one point and fell.  Abdominal pain is easing.  No fever but ? Feeling warm

## 2015-08-05 NOTE — Discharge Instructions (Signed)
It was our pleasure to provide your ER care today - we hope that you feel better.  Rest. Drink plenty of fluids.  Follow up with primary care doctor in the next 1-2 days if symptoms fail to improve/resolve.  Return to ER if worse, new symptoms, persistent vomiting, worsening or severe abdominal pain, weak/fainting, fevers, other concern.     Viral Gastroenteritis Viral gastroenteritis is also known as stomach flu. This condition affects the stomach and intestinal tract. It can cause sudden diarrhea and vomiting. The illness typically lasts 3 to 8 days. Most people develop an immune response that eventually gets rid of the virus. While this natural response develops, the virus can make you quite ill. CAUSES  Many different viruses can cause gastroenteritis, such as rotavirus or noroviruses. You can catch one of these viruses by consuming contaminated food or water. You may also catch a virus by sharing utensils or other personal items with an infected person or by touching a contaminated surface. SYMPTOMS  The most common symptoms are diarrhea and vomiting. These problems can cause a severe loss of body fluids (dehydration) and a body salt (electrolyte) imbalance. Other symptoms may include:  Fever.  Headache.  Fatigue.  Abdominal pain. DIAGNOSIS  Your caregiver can usually diagnose viral gastroenteritis based on your symptoms and a physical exam. A stool sample may also be taken to test for the presence of viruses or other infections. TREATMENT  This illness typically goes away on its own. Treatments are aimed at rehydration. The most serious cases of viral gastroenteritis involve vomiting so severely that you are not able to keep fluids down. In these cases, fluids must be given through an intravenous line (IV). HOME CARE INSTRUCTIONS   Drink enough fluids to keep your urine clear or pale yellow. Drink small amounts of fluids frequently and increase the amounts as tolerated.  Ask  your caregiver for specific rehydration instructions.  Avoid:  Foods high in sugar.  Alcohol.  Carbonated drinks.  Tobacco.  Juice.  Caffeine drinks.  Extremely hot or cold fluids.  Fatty, greasy foods.  Too much intake of anything at one time.  Dairy products until 24 to 48 hours after diarrhea stops.  You may consume probiotics. Probiotics are active cultures of beneficial bacteria. They may lessen the amount and number of diarrheal stools in adults. Probiotics can be found in yogurt with active cultures and in supplements.  Wash your hands well to avoid spreading the virus.  Only take over-the-counter or prescription medicines for pain, discomfort, or fever as directed by your caregiver. Do not give aspirin to children. Antidiarrheal medicines are not recommended.  Ask your caregiver if you should continue to take your regular prescribed and over-the-counter medicines.  Keep all follow-up appointments as directed by your caregiver. SEEK IMMEDIATE MEDICAL CARE IF:   You are unable to keep fluids down.  You do not urinate at least once every 6 to 8 hours.  You develop shortness of breath.  You notice blood in your stool or vomit. This may look like coffee grounds.  You have abdominal pain that increases or is concentrated in one small area (localized).  You have persistent vomiting or diarrhea.  You have a fever.  The patient is a child younger than 3 months, and he or she has a fever.  The patient is a child older than 3 months, and he or she has a fever and persistent symptoms.  The patient is a child older than 3 months, and  he or she has a fever and symptoms suddenly get worse.  The patient is a baby, and he or she has no tears when crying. MAKE SURE YOU:   Understand these instructions.  Will watch your condition.  Will get help right away if you are not doing well or get worse.   This information is not intended to replace advice given to you by  your health care provider. Make sure you discuss any questions you have with your health care provider.   Document Released: 04/29/2005 Document Revised: 07/22/2011 Document Reviewed: 02/13/2011 Elsevier Interactive Patient Education 2016 [employer]   Nausea and Vomiting Nausea is a sick feeling that often comes before throwing up (vomiting). Vomiting is a reflex where stomach contents come out of your mouth. Vomiting can cause severe loss of body fluids (dehydration). Children and elderly adults can become dehydrated quickly, especially if they also have diarrhea. Nausea and vomiting are symptoms of a condition or disease. It is important to find the cause of your symptoms. CAUSES   Direct irritation of the stomach lining. This irritation can result from increased acid production (gastroesophageal reflux disease), infection, food poisoning, taking certain medicines (such as nonsteroidal anti-inflammatory drugs), alcohol use, or tobacco use.  Signals from the brain.These signals could be caused by a headache, heat exposure, an inner ear disturbance, increased pressure in the brain from injury, infection, a tumor, or a concussion, pain, emotional stimulus, or metabolic problems.  An obstruction in the gastrointestinal tract (bowel obstruction).  Illnesses such as diabetes, hepatitis, gallbladder problems, appendicitis, kidney problems, cancer, sepsis, atypical symptoms of a heart attack, or eating disorders.  Medical treatments such as chemotherapy and radiation.  Receiving medicine that makes you sleep (general anesthetic) during surgery. DIAGNOSIS Your caregiver may ask for tests to be done if the problems do not improve after a few days. Tests may also be done if symptoms are severe or if the reason for the nausea and vomiting is not clear. Tests may include:  Urine tests.  Blood tests.  Stool tests.  Cultures (to look for evidence of infection).  X-rays or other imaging  studies. Test results can help your caregiver make decisions about treatment or the need for additional tests. TREATMENT You need to stay well hydrated. Drink frequently but in small amounts.You may wish to drink water, sports drinks, clear broth, or eat frozen ice pops or gelatin dessert to help stay hydrated.When you eat, eating slowly may help prevent nausea.There are also some antinausea medicines that may help prevent nausea. HOME CARE INSTRUCTIONS   Take all medicine as directed by your caregiver.  If you do not have an appetite, do not force yourself to eat. However, you must continue to drink fluids.  If you have an appetite, eat a normal diet unless your caregiver tells you differently.  Eat a variety of complex carbohydrates (rice, wheat, potatoes, bread), lean meats, yogurt, fruits, and vegetables.  Avoid high-fat foods because they are more difficult to digest.  Drink enough water and fluids to keep your urine clear or pale yellow.  If you are dehydrated, ask your caregiver for specific rehydration instructions. Signs of dehydration may include:  Severe thirst.  Dry lips and mouth.  Dizziness.  Dark urine.  Decreasing urine frequency and amount.  Confusion.  Rapid breathing or pulse. SEEK IMMEDIATE MEDICAL CARE IF:   You have blood or brown flecks (like coffee grounds) in your vomit.  You have black or bloody stools.  You have a  severe headache or stiff neck.  You are confused.  You have severe abdominal pain.  You have chest pain or trouble breathing.  You do not urinate at least once every 8 hours.  You develop cold or clammy skin.  You continue to vomit for longer than 24 to 48 hours.  You have a fever. MAKE SURE YOU:   Understand these instructions.  Will watch your condition.  Will get help right away if you are not doing well or get worse.   This information is not intended to replace advice given to you by your health care provider.  Make sure you discuss any questions you have with your health care provider.   Document Released: 04/29/2005 Document Revised: 07/22/2011 Document Reviewed: 09/26/2010 Elsevier Interactive Patient Education 2016 [place].  Diarrhea Diarrhea is frequent loose and watery bowel movements. It can cause you to feel weak and dehydrated. Dehydration can cause you to become tired and thirsty, have a dry mouth, and have decreased urination that often is dark yellow. Diarrhea is a sign of another problem, most often an infection that will not last long. In most cases, diarrhea typically lasts 2-3 days. However, it can last longer if it is a sign of something more serious. It is important to treat your diarrhea as directed by your caregiver to lessen or prevent future episodes of diarrhea. CAUSES  Some common causes include:  Gastrointestinal infections caused by viruses, bacteria, or parasites.  Food poisoning or food allergies.  Certain medicines, such as antibiotics, chemotherapy, and laxatives.  Artificial sweeteners and fructose.  Digestive disorders. HOME CARE INSTRUCTIONS  Ensure adequate fluid intake (hydration): Have 1 cup (8 oz) of fluid for each diarrhea episode. Avoid fluids that contain simple sugars or sports drinks, fruit juices, whole milk products, and sodas. Your urine should be clear or pale yellow if you are drinking enough fluids. Hydrate with an oral rehydration solution that you can purchase at pharmacies, retail stores, and online. You can prepare an oral rehydration solution at home by mixing the following ingredients together:   - tsp table salt.   tsp baking soda.   tsp salt substitute containing potassium chloride.  1  tablespoons sugar.  1 L (34 oz) of water.  Certain foods and beverages may increase the speed at which food moves through the gastrointestinal (GI) tract. These foods and beverages should be avoided and include:  Caffeinated and alcoholic  beverages.  High-fiber foods, such as raw fruits and vegetables, nuts, seeds, and whole grain breads and cereals.  Foods and beverages sweetened with sugar alcohols, such as xylitol, sorbitol, and mannitol.  Some foods may be well tolerated and may help thicken stool including:  Starchy foods, such as rice, toast, pasta, low-sugar cereal, oatmeal, grits, baked potatoes, crackers, and bagels.  Bananas.  Applesauce.  Add probiotic-rich foods to help increase healthy bacteria in the GI tract, such as yogurt and fermented milk products.  Wash your hands well after each diarrhea episode.  Only take over-the-counter or prescription medicines as directed by your caregiver.  Take a warm bath to relieve any burning or pain from frequent diarrhea episodes. SEEK IMMEDIATE MEDICAL CARE IF:   You are unable to keep fluids down.  You have persistent vomiting.  You have blood in your stool, or your stools are black and tarry.  You do not urinate in 6-8 hours, or there is only a small amount of very dark urine.  You have abdominal pain that increases or localizes.  You have weakness, dizziness, confusion, or light-headedness.  You have a severe headache.  Your diarrhea gets worse or does not get better.  You have a fever or persistent symptoms for more than 2-3 days.  You have a fever and your symptoms suddenly get worse. MAKE SURE YOU:   Understand these instructions.  Will watch your condition.  Will get help right away if you are not doing well or get worse.   This information is not intended to replace advice given to you by your health care provider. Make sure you discuss any questions you have with your health care provider.   Document Released: 04/19/2002 Document Revised: 05/20/2014 Document Reviewed: 01/05/2012 Elsevier Interactive Patient Education [employer].

## 2015-08-05 NOTE — ED Provider Notes (Signed)
CSN: [id number]     Arrival date & time 08/05/15  37 History   First MD Initiated Contact with Patient 08/05/15 1115     Chief Complaint  Patient presents with  . Abdominal Pain  . Emesis     (Consider location/radiation/quality/duration/timing/severity/associated sxs/prior Treatment) Patient is a 70 y.o. male presenting with abdominal pain and vomiting. The history is provided by the patient and a relative.  Abdominal Pain Associated symptoms: diarrhea and vomiting   Associated symptoms: no chest pain, no dysuria, no fever, no shortness of breath and no sore throat   Emesis Associated symptoms: abdominal pain and diarrhea   Associated symptoms: no headaches and no sore throat   Patient c/o nvd onset late last night and early this morning. 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 tx for pna 2 weeks ago, and finished abx this past Monday.  Last night did have some diffuse abd pain/cramping with the nvd. Had 3-4 episodes of each. Emesis clear to yellowish, not bloody. Pt indicates had [place] food last night, no known bad food ingestion. No known ill contacts.      Past Medical History  Diagnosis Date  . Medical history non-contributory   . 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 Upper gi endoscopy  2013  . Colonscopy    . Colonoscopy with propofol N/A 02/16/2013    Procedure: COLONOSCOPY WITH PROPOFOL;  Surgeon: Garlan Fair, MD;  Location: [facility];  Service: Endoscopy;  Laterality: N/A;   History reviewed. No pertinent family history. Social History  Substance Use Topics  . Smoking status: Former Research scientist (life sciences)  . Smokeless tobacco: Never Used  . Alcohol Use: No    Review of Systems  Constitutional: Negative for fever.  HENT: Negative for sore throat.   Eyes: Negative for redness.  Respiratory: Negative for shortness of breath.   Cardiovascular: Negative for chest pain.   Gastrointestinal: Positive for vomiting, abdominal pain and diarrhea.  Genitourinary: Negative for dysuria and flank pain.  Musculoskeletal: Negative for back pain and neck pain.  Skin: Negative for rash.  Neurological: Negative for headaches.  Hematological: Does not bruise/bleed easily.  Psychiatric/Behavioral: Negative for confusion.      Allergies  Review of patient's allergies indicates no known allergies.  Home Medications   Prior to Admission medications   Medication Sig Start Date End Date Taking? Authorizing Provider  aspirin EC 81 MG tablet Take 81 mg by mouth daily.    Historical Provider, MD  Multiple Vitamin (MULTIVITAMIN WITH MINERALS) TABS tablet Take 1 tablet by mouth daily.    Historical Provider, MD  polycarbophil (FIBERCON) 625 MG tablet Take 625 mg by mouth daily.    Historical Provider, MD  saw palmetto 160 MG capsule Take 160 mg by mouth 2 (two) times daily.    Historical Provider, MD   BP 105/83 mmHg  Pulse 82  Temp(Src) 99.2 F (37.3 C) (Oral)  Resp 18  SpO2 99% Physical Exam  Constitutional: He is oriented to person, place, and time. He appears well-developed and well-nourished. No distress.  HENT:  Mouth/Throat: Oropharynx is clear and moist.  Eyes: Conjunctivae are normal. No scleral icterus.  Neck: Neck supple. No tracheal deviation present.  Cardiovascular: Normal rate, regular rhythm, normal heart sounds and intact distal pulses.   No murmur heard. Pulmonary/Chest: Effort normal and breath sounds normal. No accessory muscle usage. No respiratory distress.  Abdominal: Soft.  Bowel sounds are normal. He exhibits no distension and no mass. There is no tenderness. There is no rebound and no guarding.  Genitourinary:  No cva tenderness  Musculoskeletal: Normal range of motion. He exhibits no edema.  Neurological: He is alert and oriented to person, place, and time.  Skin: Skin is warm and dry. No rash noted. He is not diaphoretic.  Psychiatric: He  has a normal mood and affect.  Nursing note and vitals reviewed.   ED Course  Procedures (including critical care time) Labs Review   Results for orders placed or performed during the hospital encounter of 08/05/15  Lipase, blood  Result Value Ref Range   Lipase 33 11 - 51 U/L  Comprehensive metabolic panel  Result Value Ref Range   Sodium 140 135 - 145 mmol/L   Potassium 4.8 3.5 - 5.1 mmol/L   Chloride 105 101 - 111 mmol/L   CO2 24 22 - 32 mmol/L   Glucose, Bld 155 (H) 65 - 99 mg/dL   BUN 20 6 - 20 mg/dL   Creatinine, Ser 1.01 0.61 - 1.24 mg/dL   Calcium 9.4 8.9 - 10.3 mg/dL   Total Protein 9.0 (H) 6.5 - 8.1 g/dL   Albumin 4.6 3.5 - 5.0 g/dL   AST 62 (H) 15 - 41 U/L   ALT 63 17 - 63 U/L   Alkaline Phosphatase 50 38 - 126 U/L   Total Bilirubin 1.5 (H) 0.3 - 1.2 mg/dL   GFR calc non Af Amer >60 >60 mL/min   GFR calc Af Amer >60 >60 mL/min   Anion gap 11 5 - 15  CBC  Result Value Ref Range   WBC 12.0 (H) 4.0 - 10.5 K/uL   RBC 5.00 4.22 - 5.81 MIL/uL   Hemoglobin 15.4 13.0 - 17.0 g/dL   HCT 44.4 39.0 - 52.0 %   MCV 88.8 78.0 - 100.0 fL   MCH 30.8 26.0 - 34.0 pg   MCHC 34.7 30.0 - 36.0 g/dL   RDW 12.8 11.5 - 15.5 %   Platelets 205 150 - 400 K/uL  Urinalysis, Routine w reflex microscopic (not at [facility])  Result Value Ref Range   Color, Urine AMBER (A) YELLOW   APPearance CLOUDY (A) CLEAR   Specific Gravity, Urine 1.025 1.005 - 1.030   pH 5.0 5.0 - 8.0   Glucose, UA NEGATIVE NEGATIVE mg/dL   Hgb urine dipstick NEGATIVE NEGATIVE   Bilirubin Urine NEGATIVE NEGATIVE   Ketones, ur NEGATIVE NEGATIVE mg/dL   Protein, ur 30 (A) NEGATIVE mg/dL   Nitrite NEGATIVE NEGATIVE   Leukocytes, UA NEGATIVE NEGATIVE  Urine microscopic-add on  Result Value Ref Range   Squamous Epithelial / LPF NONE SEEN NONE SEEN   WBC, UA 0-5 0 - 5 WBC/hpf   RBC / HPF NONE SEEN 0 - 5 RBC/hpf   Bacteria, UA RARE (A) NONE SEEN   Urine-Other MUCOUS PRESENT   I-Stat Chem 8, ED  Result Value Ref Range    Sodium 138 135 - 145 mmol/L   Potassium 4.6 3.5 - 5.1 mmol/L   Chloride 104 101 - 111 mmol/L   BUN 23 (H) 6 - 20 mg/dL   Creatinine, Ser 1.00 0.61 - 1.24 mg/dL   Glucose, Bld 152 (H) 65 - 99 mg/dL   Calcium, Ion 1.07 (L) 1.13 - 1.30 mmol/L   TCO2 22 0 - 100 mmol/L   Hemoglobin 17.3 (H) 13.0 - 17.0 g/dL   HCT 51.0 39.0 - 52.0 %  Ct Abdomen Pelvis W Contrast  08/05/2015  CLINICAL DATA:  70 year old male with a history of abdominal pain. Nausea and vomiting. EXAM: CT ABDOMEN AND PELVIS WITH CONTRAST TECHNIQUE: Multidetector CT imaging of the abdomen and pelvis was performed using the standard protocol following bolus administration of intravenous contrast. CONTRAST:  136mL ISOVUE-300 IOPAMIDOL (ISOVUE-300) INJECTION 61%, 65mL OMNIPAQUE IOHEXOL 300 MG/ML SOLN COMPARISON:  None. FINDINGS: Lower chest: Unremarkable appearance of the soft tissues of the chest wall. Heart size within normal limits.  No pericardial fluid/thickening. No lower mediastinal adenopathy. Unremarkable appearance of the distal esophagus. No hiatal hernia. No confluent airspace disease, pleural fluid, or pneumothorax within visualized lung. Abdomen/pelvis: Unremarkable appearance of liver and spleen. Unremarkable appearance of bilateral adrenal glands. No peripancreatic or pericholecystic fluid or inflammatory changes. No radio-opaque gallstones. No intrahepatic or extrahepatic biliary ductal dilatation. No intra-peritoneal free air or significant free-fluid. No abnormally dilated small bowel or colon. No transition point. No inflammatory changes of the mesenteries. Normal appendix identified. 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 Right Kidney/Ureter: No hydronephrosis. No nephrolithiasis. No perinephric stranding. Unremarkable course of the right ureter. Left Kidney/Ureter: No hydronephrosis. No nephrolithiasis. No perinephric stranding. Unremarkable course of the left ureter. Unremarkable appearance  of the urinary bladder. No significant vascular calcification. Mild atherosclerosis. No aneurysm or periaortic fluid identified. Musculoskeletal: No displaced fracture identified. Degenerative changes of the spine. No significant bony canal narrowing. IMPRESSION: No acute finding of the abdomen/pelvis. Signed, Dulcy Fanny. Earleen Newport, DO [facility] Electronically Signed   By: Corrie Mckusick D.O.   On: 08/05/2015 13:01     I have personally reviewed and evaluated these images and lab results as part of my medical decision-making.   MDM   Iv ns bolus.  Reviewed nursing notes and prior charts for additional history.   Tolerating po fluids.  abd soft nt.   No recurrent emesis.   Ct neg acute.  Pt currently appears stable for d/c.       Lajean Saver, MD 08/05/15 1314

## 2015-08-05 NOTE — ED Notes (Signed)
Bed: WA20 Expected date:  Expected time:  Means of arrival:  Comments: 

## 2015-08-05 NOTE — ED Notes (Signed)
RN will start a IV line and draw blood work.

## 2015-08-09 DIAGNOSIS — K529 Noninfective gastroenteritis and colitis, unspecified: Secondary | ICD-10-CM | POA: Diagnosis not present

## 2015-09-13 DIAGNOSIS — Z800 Family history of malignant neoplasm of digestive organs: Secondary | ICD-10-CM | POA: Diagnosis not present

## 2015-09-13 DIAGNOSIS — K861 Other chronic pancreatitis: Secondary | ICD-10-CM | POA: Diagnosis not present

## 2015-09-13 DIAGNOSIS — Z1289 Encounter for screening for malignant neoplasm of other sites: Secondary | ICD-10-CM | POA: Diagnosis not present

## 2015-09-13 DIAGNOSIS — Z79899 Other long term (current) drug therapy: Secondary | ICD-10-CM | POA: Diagnosis not present

## 2015-10-25 DIAGNOSIS — G1221 Amyotrophic lateral sclerosis: Secondary | ICD-10-CM | POA: Diagnosis not present

## 2015-11-15 DIAGNOSIS — R29898 Other symptoms and signs involving the musculoskeletal system: Secondary | ICD-10-CM | POA: Diagnosis not present

## 2015-11-15 DIAGNOSIS — M6258 Muscle wasting and atrophy, not elsewhere classified, other site: Secondary | ICD-10-CM | POA: Diagnosis not present

## 2015-11-15 DIAGNOSIS — R531 Weakness: Secondary | ICD-10-CM | POA: Diagnosis not present

## 2015-11-15 DIAGNOSIS — G1221 Amyotrophic lateral sclerosis: Secondary | ICD-10-CM | POA: Diagnosis not present

## 2016-01-17 DIAGNOSIS — G1221 Amyotrophic lateral sclerosis: Secondary | ICD-10-CM | POA: Diagnosis not present

## 2016-01-18 DIAGNOSIS — M5021 Other cervical disc displacement,  high cervical region: Secondary | ICD-10-CM | POA: Diagnosis not present

## 2016-01-19 ENCOUNTER — Other Ambulatory Visit: Payer: Self-pay | Admitting: Neurosurgery

## 2016-01-19 DIAGNOSIS — M5021 Other cervical disc displacement,  high cervical region: Secondary | ICD-10-CM

## 2016-01-26 ENCOUNTER — Ambulatory Visit
Admission: RE | Admit: 2016-01-26 | Discharge: 2016-01-26 | Disposition: A | Discharge status: Home / Self Care | Payer: Medicare Other | Source: Ambulatory Visit | Attending: Neurosurgery | Admitting: Neurosurgery

## 2016-01-26 DIAGNOSIS — M4802 Spinal stenosis, cervical region: Secondary | ICD-10-CM | POA: Diagnosis not present

## 2016-01-26 DIAGNOSIS — M5021 Other cervical disc displacement,  high cervical region: Secondary | ICD-10-CM

## 2016-01-31 DIAGNOSIS — Z23 Encounter for immunization: Secondary | ICD-10-CM | POA: Diagnosis not present

## 2016-02-19 DIAGNOSIS — M5021 Other cervical disc displacement,  high cervical region: Secondary | ICD-10-CM | POA: Diagnosis not present

## 2016-03-06 DIAGNOSIS — H04123 Dry eye syndrome of bilateral lacrimal glands: Secondary | ICD-10-CM | POA: Diagnosis not present

## 2016-03-06 DIAGNOSIS — H25813 Combined forms of age-related cataract, bilateral: Secondary | ICD-10-CM | POA: Diagnosis not present

## 2016-04-08 DIAGNOSIS — J069 Acute upper respiratory infection, unspecified: Secondary | ICD-10-CM | POA: Diagnosis not present

## 2016-05-15 DIAGNOSIS — G1221 Amyotrophic lateral sclerosis: Secondary | ICD-10-CM | POA: Diagnosis not present

## 2016-05-15 DIAGNOSIS — Z79899 Other long term (current) drug therapy: Secondary | ICD-10-CM | POA: Diagnosis not present

## 2016-05-15 DIAGNOSIS — R531 Weakness: Secondary | ICD-10-CM | POA: Diagnosis not present

## 2016-06-20 DIAGNOSIS — G1221 Amyotrophic lateral sclerosis: Secondary | ICD-10-CM | POA: Diagnosis not present

## 2016-06-20 DIAGNOSIS — Z0001 Encounter for general adult medical examination with abnormal findings: Secondary | ICD-10-CM | POA: Diagnosis not present

## 2016-06-20 DIAGNOSIS — Z125 Encounter for screening for malignant neoplasm of prostate: Secondary | ICD-10-CM | POA: Diagnosis not present

## 2016-06-20 DIAGNOSIS — Z1389 Encounter for screening for other disorder: Secondary | ICD-10-CM | POA: Diagnosis not present

## 2016-06-20 DIAGNOSIS — R634 Abnormal weight loss: Secondary | ICD-10-CM | POA: Diagnosis not present

## 2016-06-27 DIAGNOSIS — Z23 Encounter for immunization: Secondary | ICD-10-CM | POA: Diagnosis not present

## 2016-07-08 ENCOUNTER — Other Ambulatory Visit: Payer: Self-pay | Admitting: Internal Medicine

## 2016-07-08 ENCOUNTER — Ambulatory Visit
Admission: RE | Admit: 2016-07-08 | Discharge: 2016-07-08 | Disposition: A | Discharge status: Home / Self Care | Payer: Medicare Other | Source: Ambulatory Visit | Attending: Internal Medicine | Admitting: Internal Medicine

## 2016-07-08 DIAGNOSIS — R140 Abdominal distension (gaseous): Secondary | ICD-10-CM

## 2016-07-08 DIAGNOSIS — R109 Unspecified abdominal pain: Secondary | ICD-10-CM | POA: Diagnosis not present

## 2016-07-12 ENCOUNTER — Emergency Department (HOSPITAL_COMMUNITY)
Admission: EM | Admit: 2016-07-12 | Discharge: 2016-07-12 | Disposition: A | Discharge status: Home / Self Care | Payer: Medicare Other | Attending: Emergency Medicine | Admitting: Emergency Medicine

## 2016-07-12 ENCOUNTER — Emergency Department (HOSPITAL_COMMUNITY): Payer: Medicare Other

## 2016-07-12 ENCOUNTER — Encounter (HOSPITAL_COMMUNITY): Payer: Self-pay | Admitting: Emergency Medicine

## 2016-07-12 DIAGNOSIS — Z7982 Long term (current) use of aspirin: Secondary | ICD-10-CM | POA: Insufficient documentation

## 2016-07-12 DIAGNOSIS — R0789 Other chest pain: Secondary | ICD-10-CM | POA: Diagnosis not present

## 2016-07-12 DIAGNOSIS — R079 Chest pain, unspecified: Secondary | ICD-10-CM

## 2016-07-12 DIAGNOSIS — Z87891 Personal history of nicotine dependence: Secondary | ICD-10-CM | POA: Insufficient documentation

## 2016-07-12 LAB — BASIC METABOLIC PANEL
ANION GAP: 10 (ref 5–15)
BUN: 11 mg/dL (ref 6–20)
CHLORIDE: 102 mmol/L (ref 101–111)
CO2: 28 mmol/L (ref 22–32)
Calcium: 9.5 mg/dL (ref 8.9–10.3)
Creatinine, Ser: 0.85 mg/dL (ref 0.61–1.24)
GFR calc non Af Amer: >60 mL/min (ref >60)
Glucose, Bld: 97 mg/dL (ref 65–99)
Potassium: 4.1 mmol/L (ref 3.5–5.1)
Sodium: 140 mmol/L (ref 135–145)

## 2016-07-12 LAB — CBC
HEMATOCRIT: 40.6 % (ref 39.0–52.0)
HEMOGLOBIN: 13.7 g/dL (ref 13.0–17.0)
MCH: 29.7 pg (ref 26.0–34.0)
MCHC: 33.7 g/dL (ref 30.0–36.0)
MCV: 87.9 fL (ref 78.0–100.0)
Platelets: 171 10*3/uL (ref 150–400)
RBC: 4.62 MIL/uL (ref 4.22–5.81)
RDW: 12.7 % (ref 11.5–15.5)
WBC: 6.5 10*3/uL (ref 4.0–10.5)

## 2016-07-12 LAB — I-STAT TROPONIN, ED
Troponin i, poc: 0 ng/mL (ref 0.00–0.08)
Troponin i, poc: 0 ng/mL (ref 0.00–0.08)

## 2016-07-12 MED ORDER — ASPIRIN 81 MG PO CHEW: 324.0000 mg | CHEWABLE_TABLET | Freq: Once | ORAL | Status: DC

## 2016-07-12 NOTE — ED Provider Notes (Signed)
[facility] DEPT Provider Note   CSN: [id number] Arrival date & time: 07/12/16  0344     History   Chief Complaint Chief Complaint  Patient presents with  . Chest Pain    HPI Albert Leonard is a 71 y.o. male.  He was awakened and 3 AM by a pressure feeling across his lower chest. This feeling lasted about one hour before resolving. There is no associated dyspnea, nausea, diaphoresis. There is no radiation of discomfort. Earlier in the day, he had noted some pressure feeling across his lower abdomen and thought it was gas. When the discomfort was there, he did notice that if he belched, the tightness would ease off. He denies having had similar symptoms previously. He is a nonsmoker with no history of diabetes or hypertension or hyperlipidemia. 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 ALS is mainly affecting his upper extremities.   The history is provided by the patient.  Chest Pain      Past Medical History:  Diagnosis Date  . 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 Medical history non-contributory     There are no active problems to display for this patient.   Past Surgical History:  Procedure Laterality Date  . COLONOSCOPY WITH PROPOFOL N/A 02/16/2013   Procedure: COLONOSCOPY WITH PROPOFOL;  Surgeon: Garlan Fair, MD;  Location: [facility];  Service: Endoscopy;  Laterality: N/A;  . colonscopy    . UPPER GI ENDOSCOPY  2013       Home Medications    Prior to Admission medications   Medication Sig Start Date End Date Taking? Authorizing Provider  Ascorbic Acid (VITAMIN C PO) Take 1 tablet by mouth daily.    Historical Provider, MD  aspirin EC 81 MG tablet Take 81 mg by mouth daily.    Historical Provider, MD  Multiple Vitamin (MULTIVITAMIN WITH MINERALS) TABS tablet Take 1 tablet by mouth daily.    Historical Provider, MD  polycarbophil (FIBERCON) 625 MG tablet Take 1,250 mg by mouth daily.     Historical Provider, MD  riluzole (RILUTEK) 50 MG tablet Take 50 mg by  mouth every 12 (twelve) hours.    Historical Provider, MD  saw palmetto 160 MG capsule Take 160 mg by mouth daily.     Historical Provider, MD    Family History History reviewed. No pertinent family history.  Social History Social History  Substance Use Topics  . Smoking status: Former Research scientist (life sciences)  . Smokeless tobacco: Never Used  . Alcohol use No     Allergies   Patient has no known allergies.   Review of Systems Review of Systems  Cardiovascular: Positive for chest pain.  All other systems reviewed and are negative.    Physical Exam Updated Vital Signs BP 118/89 (BP Location: Right Arm)   Pulse 73   Temp 98.5 F (36.9 C) (Oral)   Resp 17   SpO2 100%   Physical Exam  Nursing note and vitals reviewed.  71 year old male, resting comfortably and in no acute distress. Vital signs are normal. Oxygen saturation is 100%, which is normal. Head is normocephalic and atraumatic. PERRLA, EOMI. Oropharynx is clear. Neck is nontender and supple without adenopathy or JVD. Back is nontender and there is no CVA tenderness. Lungs are clear without rales, wheezes, or rhonchi. Chest is nontender. Heart has regular rate and rhythm without murmur. Abdomen is soft, flat, nontender without masses or hepatosplenomegaly and peristalsis is normoactive. Extremities have no cyanosis or edema, full range of  motion is present. Moderate muscle wasting is noted in upper extremities. Skin is warm and dry without rash. Neurologic: Mental status is normal, cranial nerves are intact, there are no motor or sensory deficits.  ED Treatments / Results  Labs (all labs ordered are listed, but only abnormal results are displayed) [place], ED  I-STAT TROPOININ, ED    EKG  EKG Interpretation  Date/Time:  Friday July 12 2016 03:56:13 EST Ventricular Rate:  69 PR Interval:  178 QRS Duration: 64 QT Interval:  398 QTC Calculation: 426 R  Axis:   48 Text Interpretation:  Normal sinus rhythm Normal ECG Artifact No old tracing to compare Confirmed by [facility]  MD, Erwin Nishiyama ([id number]) on 07/12/2016 4:07:29 AM       Radiology Dg Chest 2 View  Result Date: 07/12/2016 CLINICAL DATA:  Acute onset of generalized chest pressure. Initial encounter. EXAM: CHEST  2 VIEW COMPARISON:  None. FINDINGS: The lungs are well-aerated and clear. There is no evidence of focal opacification, pleural effusion or pneumothorax. The heart is normal in size; the mediastinal contour is within normal limits. No acute osseous abnormalities are seen. IMPRESSION: No acute cardiopulmonary process seen. Electronically Signed   By: Garald Balding M.D.   On: 07/12/2016 04:33    Procedures Procedures (including critical care time)  Medications Ordered in ED Medications - No data to display   Initial Impression / Assessment and Plan / ED Course  I have reviewed the triage vital signs and the nursing notes.  Pertinent labs & imaging results that were available during my care of the patient were reviewed by me and considered in my medical decision making (see chart for details).  Chest discomfort with very low likelihood of cardiac disease. Heart score = 0. He has been symptom-free for 3 hours at this point. Will check a delta troponin. If negative, we'll discharge with instructions to follow-up with PCP.  Repeat troponin is not detectable. Patient is discharged.  Final Clinical Impressions(s) / ED Diagnoses   Final diagnoses:  Nonspecific chest pain    New Prescriptions New Prescriptions   No medications on file     Delora Fuel, MD [phone]

## 2016-07-12 NOTE — ED Triage Notes (Signed)
Patient with chest pressure that started about one hour 30 mins ago.  Patient does have some shortness of breath, wife states that he had a hard time catching his breath.  He denies any nausea at this time.  Patient is CAOx4 at this time. Patient states that the pressure awoke him from sleep.

## 2016-08-28 DIAGNOSIS — R531 Weakness: Secondary | ICD-10-CM | POA: Diagnosis not present

## 2016-08-28 DIAGNOSIS — G1221 Amyotrophic lateral sclerosis: Secondary | ICD-10-CM | POA: Diagnosis not present

## 2016-08-28 DIAGNOSIS — Z79899 Other long term (current) drug therapy: Secondary | ICD-10-CM | POA: Diagnosis not present

## 2016-09-13 DIAGNOSIS — G1221 Amyotrophic lateral sclerosis: Secondary | ICD-10-CM | POA: Diagnosis not present

## 2017-02-06 DIAGNOSIS — Z23 Encounter for immunization: Secondary | ICD-10-CM | POA: Diagnosis not present

## 2017-02-26 DIAGNOSIS — Z9181 History of falling: Secondary | ICD-10-CM | POA: Diagnosis not present

## 2017-02-26 DIAGNOSIS — Z79899 Other long term (current) drug therapy: Secondary | ICD-10-CM | POA: Diagnosis not present

## 2017-02-26 DIAGNOSIS — R000 Tachycardia, unspecified: Secondary | ICD-10-CM | POA: Diagnosis not present

## 2017-02-26 DIAGNOSIS — G1221 Amyotrophic lateral sclerosis: Secondary | ICD-10-CM | POA: Diagnosis not present

## 2017-02-26 DIAGNOSIS — R253 Fasciculation: Secondary | ICD-10-CM | POA: Diagnosis not present

## 2017-02-26 DIAGNOSIS — R531 Weakness: Secondary | ICD-10-CM | POA: Diagnosis not present

## 2017-05-19 DIAGNOSIS — G1221 Amyotrophic lateral sclerosis: Secondary | ICD-10-CM | POA: Diagnosis not present

## 2017-07-03 DIAGNOSIS — Z1389 Encounter for screening for other disorder: Secondary | ICD-10-CM | POA: Diagnosis not present

## 2017-07-03 DIAGNOSIS — Z0000 Encounter for general adult medical examination without abnormal findings: Secondary | ICD-10-CM | POA: Diagnosis not present

## 2017-07-03 DIAGNOSIS — E7800 Pure hypercholesterolemia, unspecified: Secondary | ICD-10-CM | POA: Diagnosis not present

## 2017-07-03 DIAGNOSIS — G1221 Amyotrophic lateral sclerosis: Secondary | ICD-10-CM | POA: Diagnosis not present

## 2017-08-27 DIAGNOSIS — Z79899 Other long term (current) drug therapy: Secondary | ICD-10-CM | POA: Diagnosis not present

## 2017-08-27 DIAGNOSIS — G1221 Amyotrophic lateral sclerosis: Secondary | ICD-10-CM | POA: Diagnosis not present

## 2017-09-12 DIAGNOSIS — G1221 Amyotrophic lateral sclerosis: Secondary | ICD-10-CM | POA: Diagnosis not present

## 2017-09-12 DIAGNOSIS — Z515 Encounter for palliative care: Secondary | ICD-10-CM | POA: Diagnosis not present

## 2017-09-14 IMAGING — CR DG ABDOMEN 2V
2 series · 2 of 2 positions shown · non-contrast
Comparison: CT abdomen and pelvis 08/05/2015

CLINICAL DATA: Abdominal pain and bloating for 1 month

EXAM:
ABDOMEN - 2 VIEW

[w abdomen upright *]
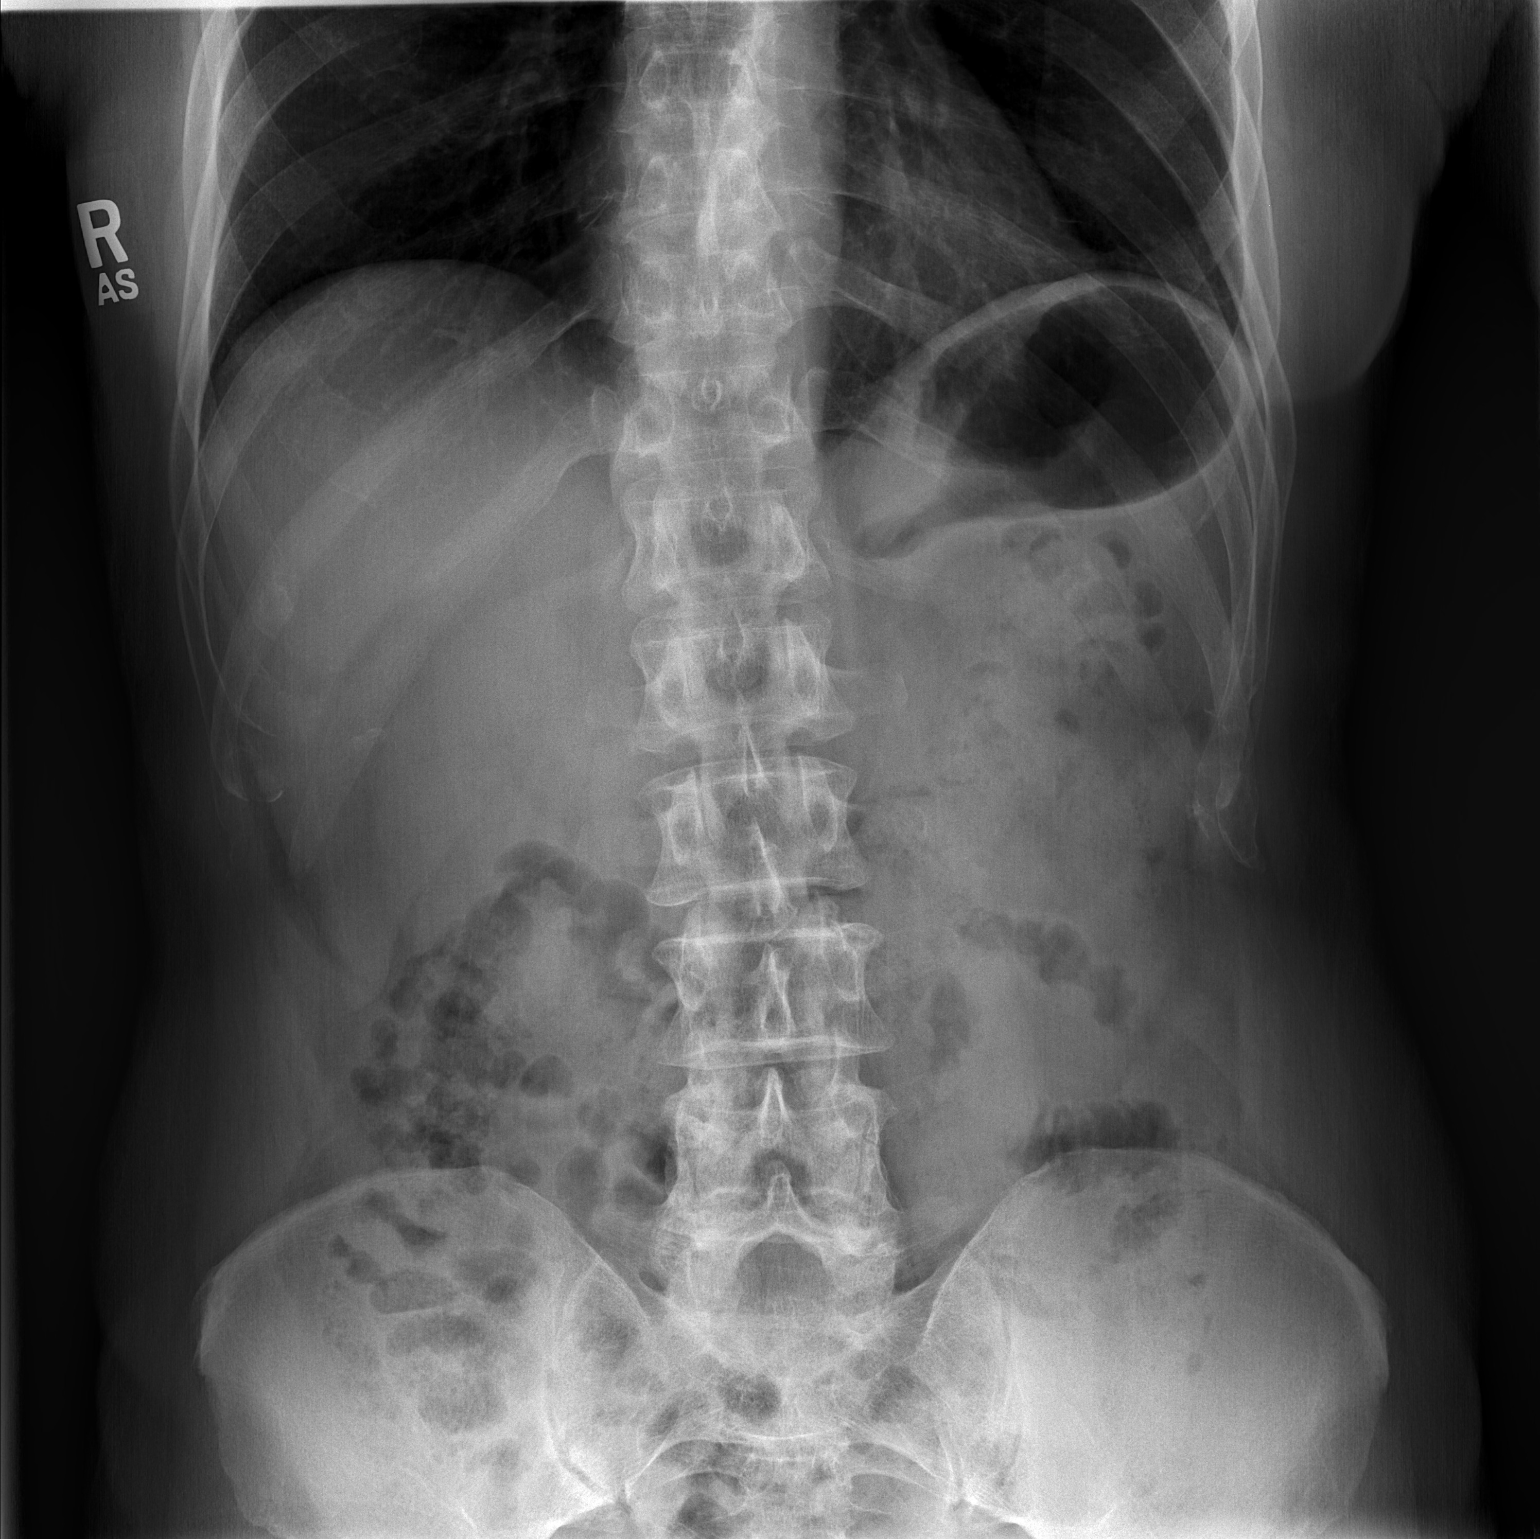

[t abdomen supine]
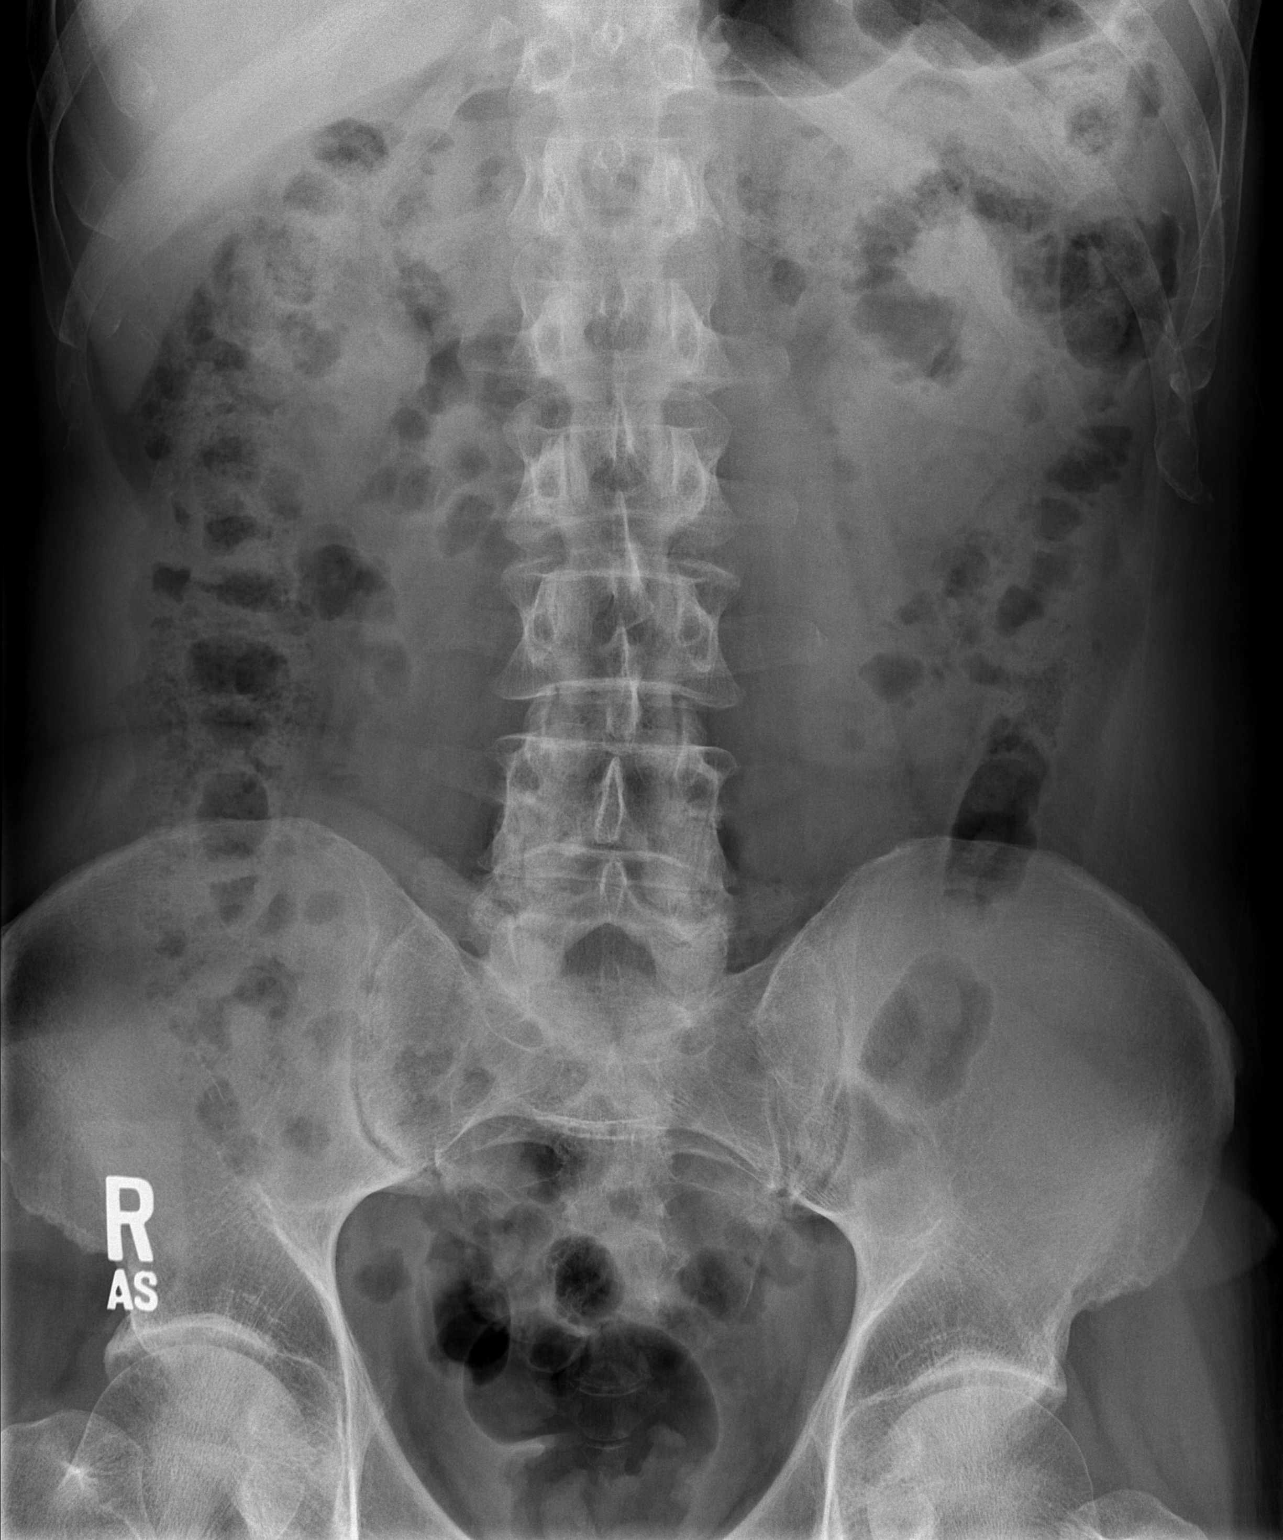

[2 of 2 positions shown; findings below may reference images not displayed]

FINDINGS: Lung bases clear.

Nonobstructive bowel gas pattern.

No bowel dilatation or bowel wall thickening.

No free intraperitoneal air.

Osseous demineralization.

No urinary tract calcification.
IMPRESSION: No acute abnormalities.

## 2017-09-18 IMAGING — CR DG CHEST 2V
2 series · 2 of 2 positions shown · non-contrast
Comparison: None.

CLINICAL DATA: Acute onset of generalized chest pressure. Initial
encounter.

EXAM:
CHEST  2 VIEW

[chest pa]
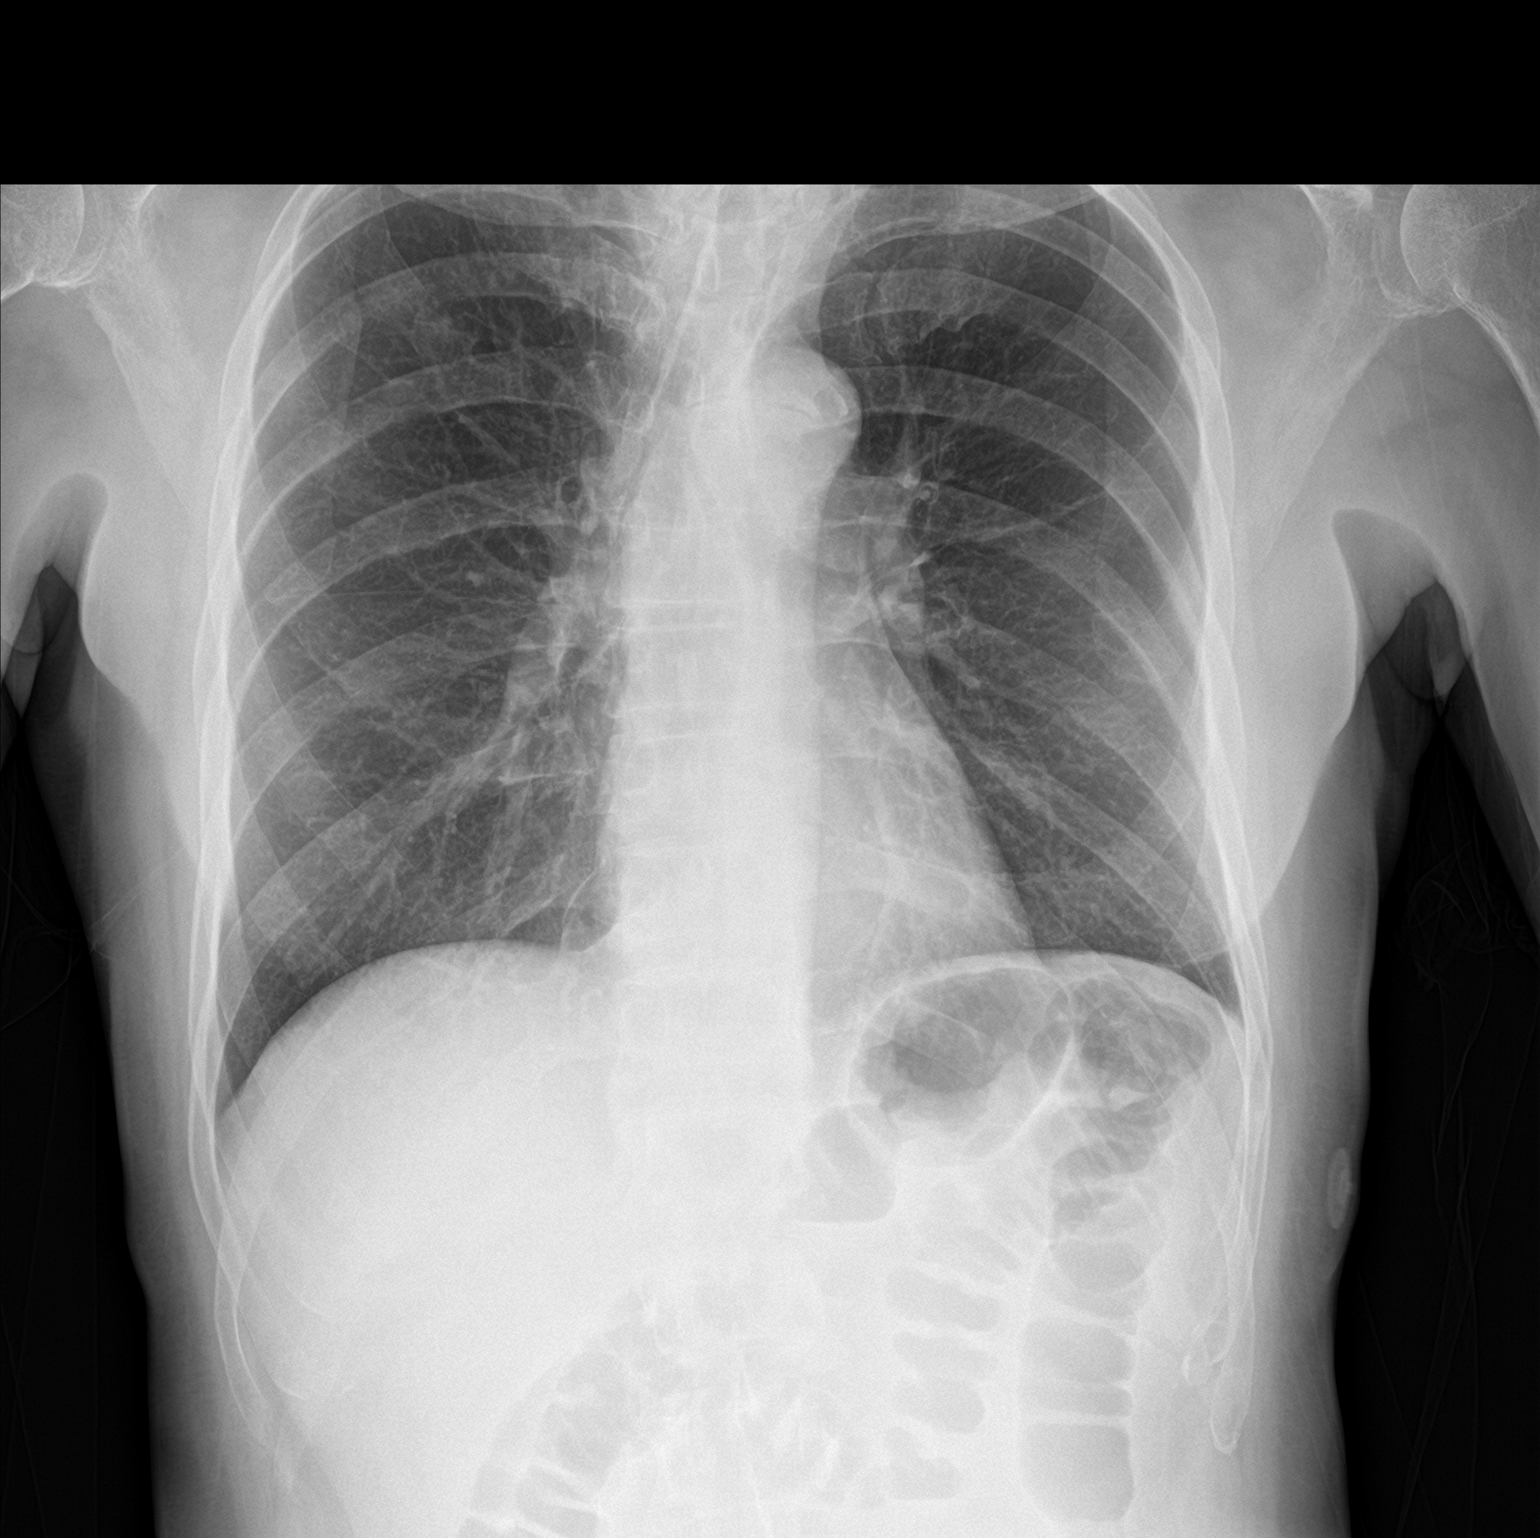

[chest lat]
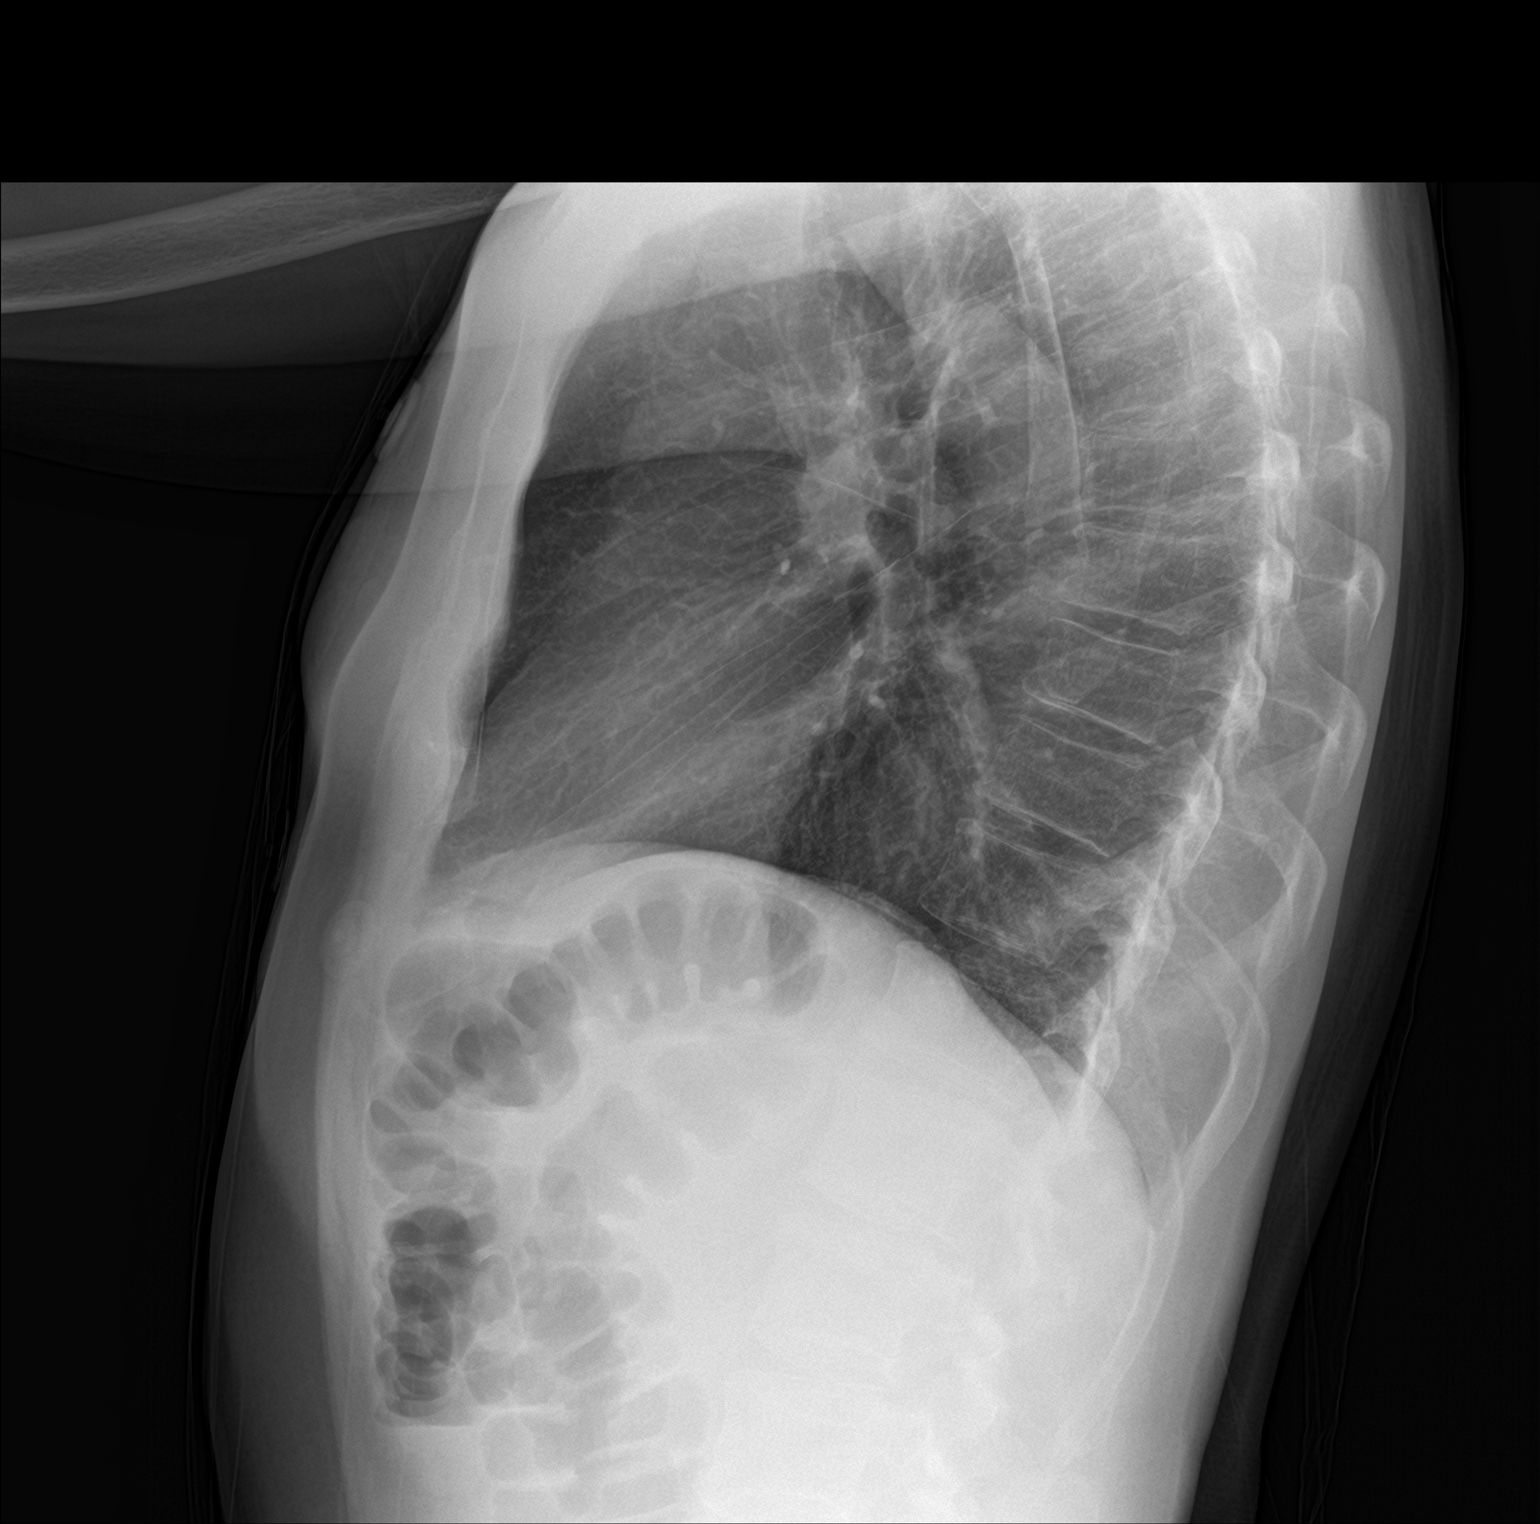

[2 of 2 positions shown; findings below may reference images not displayed]

FINDINGS: The lungs are well-aerated and clear. There is no evidence of focal
opacification, pleural effusion or pneumothorax.

The heart is normal in size; the mediastinal contour is within
normal limits. No acute osseous abnormalities are seen.
IMPRESSION: No acute cardiopulmonary process seen.

## 2017-10-03 ENCOUNTER — Encounter (HOSPITAL_COMMUNITY): Payer: Self-pay | Admitting: Emergency Medicine

## 2017-10-03 ENCOUNTER — Other Ambulatory Visit: Payer: Self-pay

## 2017-10-03 ENCOUNTER — Emergency Department (HOSPITAL_COMMUNITY): Payer: Medicare Other

## 2017-10-03 ENCOUNTER — Emergency Department (HOSPITAL_COMMUNITY)
Admission: EM | Admit: 2017-10-03 | Discharge: 2017-10-04 | Disposition: A | Discharge status: Home / Self Care | Payer: Medicare Other | Attending: Emergency Medicine | Admitting: Emergency Medicine

## 2017-10-03 DIAGNOSIS — M542 Cervicalgia: Secondary | ICD-10-CM | POA: Diagnosis not present

## 2017-10-03 DIAGNOSIS — Y939 Activity, unspecified: Secondary | ICD-10-CM | POA: Diagnosis not present

## 2017-10-03 DIAGNOSIS — W108XXA Fall (on) (from) other stairs and steps, initial encounter: Secondary | ICD-10-CM | POA: Insufficient documentation

## 2017-10-03 DIAGNOSIS — S299XXA Unspecified injury of thorax, initial encounter: Secondary | ICD-10-CM | POA: Diagnosis not present

## 2017-10-03 DIAGNOSIS — W19XXXA Unspecified fall, initial encounter: Secondary | ICD-10-CM

## 2017-10-03 DIAGNOSIS — Z7982 Long term (current) use of aspirin: Secondary | ICD-10-CM | POA: Diagnosis not present

## 2017-10-03 DIAGNOSIS — Y999 Unspecified external cause status: Secondary | ICD-10-CM | POA: Diagnosis not present

## 2017-10-03 DIAGNOSIS — Y929 Unspecified place or not applicable: Secondary | ICD-10-CM | POA: Insufficient documentation

## 2017-10-03 DIAGNOSIS — R079 Chest pain, unspecified: Secondary | ICD-10-CM | POA: Diagnosis not present

## 2017-10-03 MED ORDER — KETOROLAC TROMETHAMINE 60 MG/2ML IM SOLN
30.0000 mg | Freq: Once | INTRAMUSCULAR | Status: AC
  Administered 2017-10-04: 30 mg via INTRAMUSCULAR
  Filled 2017-10-03: qty 2

## 2017-10-03 NOTE — ED Notes (Signed)
LS diminished Right, trachea midline, painful to palpation R chest, no crepitus noted.

## 2017-10-03 NOTE — ED Notes (Signed)
ED Provider at bedside. 

## 2017-10-03 NOTE — ED Triage Notes (Signed)
Pt stats while walking up steps his foot slipped causing him to slide down @ 6 hardwood steps on chest. Pt c/o pain to chest, worse with breathing.  Pt has hx of ALS

## 2017-10-04 DIAGNOSIS — S299XXA Unspecified injury of thorax, initial encounter: Secondary | ICD-10-CM | POA: Diagnosis not present

## 2017-10-04 MED ORDER — DICLOFENAC SODIUM ER 100 MG PO TB24: 100.0000 mg | ORAL_TABLET | Freq: Every day | ORAL | 0 refills | Status: DC

## 2017-10-04 NOTE — ED Notes (Signed)
Unable to obtain signature for discharge d/t no WOW with functional signature pad available. Pt expressed understanding and had no additional questions regarding his dx. Pt departed in NAD, refused use of wheelchair.

## 2017-10-04 NOTE — ED Notes (Signed)
Hot pack applied to central chest.

## 2017-10-04 NOTE — ED Provider Notes (Signed)
[facility] EMERGENCY DEPARTMENT Provider Note   CSN: [id number] Arrival date & time: 10/03/17  2229     History   Chief Complaint Chief Complaint  Patient presents with  . Fall    HPI Albert Leonard is a 72 y.o. male.  The history is provided by the patient.  Fall  This is a new problem. The current episode started 3 to 5 hours ago. The problem occurs constantly. The problem has not changed since onset.Pertinent negatives include no abdominal pain, no headaches and no shortness of breath. Associated symptoms comments: Hit left lower ribs. Nothing aggravates the symptoms. Nothing relieves the symptoms. He has tried nothing for the symptoms. The treatment provided no relief.  Tripped going up stairs hitting his chest wall.  Did not strike head, no LOC.  No back neck hip nor knee pain.    Past Medical History:  Diagnosis Date  . 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 Medical history non-contributory     There are no active problems to display for this patient.   Past Surgical History:  Procedure Laterality Date  . COLONOSCOPY WITH PROPOFOL N/A 02/16/2013   Procedure: COLONOSCOPY WITH PROPOFOL;  Surgeon: Garlan Fair, MD;  Location: [facility];  Service: Endoscopy;  Laterality: N/A;  . colonscopy    . UPPER GI ENDOSCOPY  2013        Home Medications    Prior to Admission medications   Medication Sig Start Date End Date Taking? Authorizing Provider  Ascorbic Acid (VITAMIN C PO) Take 1 tablet by mouth daily.    [provider]  aspirin EC 81 MG tablet Take 81 mg by mouth daily.    [provider]  Multiple Vitamin (MULTIVITAMIN WITH MINERALS) TABS tablet Take 1 tablet by mouth daily.    [provider]  polycarbophil (FIBERCON) 625 MG tablet Take 1,250 mg by mouth daily.     [provider]  riluzole (RILUTEK) 50 MG tablet Take 50 mg by mouth every 12 (twelve) hours.    [provider]  saw palmetto 160 MG capsule  Take 160 mg by mouth daily.     [provider]  TURMERIC PO Take 1 tablet by mouth daily.    [provider]    Family History No family history on file.  Social History Social History   Tobacco Use  . Smoking status: Never Smoker  . Smokeless tobacco: Never Used  Substance Use Topics  . Alcohol use: No  . Drug use: No     Allergies   Patient has no known allergies.   Review of Systems Review of Systems  Constitutional: Negative for fever.  Eyes: Negative for visual disturbance.  Respiratory: Negative for shortness of breath.   Gastrointestinal: Negative for abdominal pain.  Musculoskeletal: Positive for arthralgias and neck pain. Negative for back pain.  Neurological: Negative for dizziness, facial asymmetry and headaches.  All other systems reviewed and are negative.    Physical Exam Updated Vital Signs BP (!) 126/111 (BP Location: Right Arm)   Pulse 96   Temp 98 F (36.7 C) (Oral)   Resp 16   Ht 6\' 2"  (1.88 m)   Wt 73 kg (161 lb)   SpO2 99%   BMI 20.67 kg/m   Physical Exam  Constitutional: He is oriented to person, place, and time. He appears well-developed and well-nourished. No distress.  HENT:  Head: Normocephalic and atraumatic.  Right Ear: External ear normal.  Left Ear:  External ear normal.  Mouth/Throat: No oropharyngeal exudate.  Eyes: Pupils are equal, round, and reactive to light. Conjunctivae are normal.  Neck: Normal range of motion. Neck supple.  Cardiovascular: Normal rate, regular rhythm, normal heart sounds and intact distal pulses.  Pulmonary/Chest: Effort normal and breath sounds normal. No stridor. He has no wheezes. He has no rales. He exhibits no tenderness.  Abdominal: Soft. Bowel sounds are normal. He exhibits no mass. There is no tenderness. There is no rebound and no guarding.  Musculoskeletal: Normal range of motion.  Neurological: He is alert and oriented to person, place, and time. He displays normal  reflexes. He exhibits normal muscle tone. Coordination normal.  Skin: Skin is warm and dry. Capillary refill takes less than 2 seconds.  Psychiatric: He has a normal mood and affect.     ED Treatments / Results  Labs (all labs ordered are listed, but only abnormal results are displayed) Labs Reviewed - No data to display  EKG None  Radiology Dg Chest 2 View  Result Date: 10/03/2017 CLINICAL DATA:  Anterior chest pain after a fall tonight. Shortness of breath. EXAM: CHEST - 2 VIEW COMPARISON:  07/12/2016 FINDINGS: Elevation of the left hemidiaphragm. Heart size and pulmonary vascularity are normal. No airspace disease or consolidation in the lungs. No blunting of costophrenic angles. No pneumothorax. Mediastinal contours appear intact. Calcification of the aorta. IMPRESSION: Elevation of left hemidiaphragm. No evidence of active pulmonary disease. Aortic atherosclerosis. Electronically Signed   By: Lucienne Capers M.D.   On: 10/03/2017 23:48    Procedures Procedures (including critical care time)  Medications Ordered in ED Medications  ketorolac (TORADOL) injection 30 mg (has no administration in time range)       Final Clinical Impressions(s) / ED Diagnoses   Return for weakness, numbness, changes in vision or speech, fevers >100.4 unrelieved by medication, shortness of breath, intractable vomiting, or diarrhea, abdominal pain, Inability to tolerate liquids or food, cough, altered mental status or any concerns. No signs of systemic illness or infection. The patient is nontoxic-appearing on exam and vital signs are within normal limits.   I have reviewed the triage vital signs and the nursing notes. Pertinent labs &imaging results that were available during my care of the patient were reviewed by me and considered in my medical decision making (see chart for details).  After history, exam, and medical workup I feel the patient has been appropriately medically screened and  is safe for discharge home. Pertinent diagnoses were discussed with the patient. Patient was given return precautions.     Debara Kamphuis, MD 10/04/17 1610

## 2017-10-09 DIAGNOSIS — G1221 Amyotrophic lateral sclerosis: Secondary | ICD-10-CM | POA: Diagnosis not present

## 2017-10-09 DIAGNOSIS — Z515 Encounter for palliative care: Secondary | ICD-10-CM | POA: Diagnosis not present

## 2017-10-10 DIAGNOSIS — G1221 Amyotrophic lateral sclerosis: Secondary | ICD-10-CM | POA: Diagnosis not present

## 2017-10-10 DIAGNOSIS — W108XXD Fall (on) (from) other stairs and steps, subsequent encounter: Secondary | ICD-10-CM | POA: Diagnosis not present

## 2017-10-10 DIAGNOSIS — R0789 Other chest pain: Secondary | ICD-10-CM | POA: Diagnosis not present

## 2017-11-26 DIAGNOSIS — Z79899 Other long term (current) drug therapy: Secondary | ICD-10-CM | POA: Diagnosis not present

## 2017-11-26 DIAGNOSIS — Z7409 Other reduced mobility: Secondary | ICD-10-CM | POA: Diagnosis not present

## 2017-11-26 DIAGNOSIS — G1221 Amyotrophic lateral sclerosis: Secondary | ICD-10-CM | POA: Diagnosis not present

## 2017-12-11 DIAGNOSIS — Z515 Encounter for palliative care: Secondary | ICD-10-CM | POA: Diagnosis not present

## 2017-12-11 DIAGNOSIS — G1221 Amyotrophic lateral sclerosis: Secondary | ICD-10-CM | POA: Diagnosis not present

## 2017-12-25 DIAGNOSIS — G1221 Amyotrophic lateral sclerosis: Secondary | ICD-10-CM | POA: Diagnosis not present

## 2017-12-25 DIAGNOSIS — Z01812 Encounter for preprocedural laboratory examination: Secondary | ICD-10-CM | POA: Diagnosis not present

## 2017-12-29 DIAGNOSIS — R05 Cough: Secondary | ICD-10-CM | POA: Diagnosis not present

## 2017-12-29 DIAGNOSIS — R0981 Nasal congestion: Secondary | ICD-10-CM | POA: Diagnosis not present

## 2017-12-29 DIAGNOSIS — J00 Acute nasopharyngitis [common cold]: Secondary | ICD-10-CM | POA: Diagnosis not present

## 2017-12-31 ENCOUNTER — Encounter (HOSPITAL_COMMUNITY): Payer: Self-pay | Admitting: Emergency Medicine

## 2017-12-31 ENCOUNTER — Inpatient Hospital Stay (HOSPITAL_COMMUNITY)
Admission: EM | Admit: 2017-12-31 | Discharge: 2018-01-02 | DRG: 193 | Disposition: A | Discharge status: Home Health | Payer: Medicare Other | Attending: Internal Medicine | Admitting: Internal Medicine

## 2017-12-31 ENCOUNTER — Emergency Department (HOSPITAL_COMMUNITY): Payer: Medicare Other

## 2017-12-31 ENCOUNTER — Other Ambulatory Visit: Payer: Self-pay

## 2017-12-31 DIAGNOSIS — Z66 Do not resuscitate: Secondary | ICD-10-CM | POA: Diagnosis present

## 2017-12-31 DIAGNOSIS — R918 Other nonspecific abnormal finding of lung field: Secondary | ICD-10-CM | POA: Diagnosis present

## 2017-12-31 DIAGNOSIS — R0789 Other chest pain: Secondary | ICD-10-CM | POA: Diagnosis not present

## 2017-12-31 DIAGNOSIS — J9620 Acute and chronic respiratory failure, unspecified whether with hypoxia or hypercapnia: Secondary | ICD-10-CM

## 2017-12-31 DIAGNOSIS — Z79899 Other long term (current) drug therapy: Secondary | ICD-10-CM | POA: Diagnosis not present

## 2017-12-31 DIAGNOSIS — R627 Adult failure to thrive: Secondary | ICD-10-CM | POA: Diagnosis not present

## 2017-12-31 DIAGNOSIS — D638 Anemia in other chronic diseases classified elsewhere: Secondary | ICD-10-CM | POA: Diagnosis present

## 2017-12-31 DIAGNOSIS — J189 Pneumonia, unspecified organism: Secondary | ICD-10-CM | POA: Diagnosis present

## 2017-12-31 DIAGNOSIS — G1221 Amyotrophic lateral sclerosis: Secondary | ICD-10-CM | POA: Diagnosis present

## 2017-12-31 DIAGNOSIS — R1310 Dysphagia, unspecified: Secondary | ICD-10-CM

## 2017-12-31 DIAGNOSIS — J181 Lobar pneumonia, unspecified organism: Principal | ICD-10-CM | POA: Diagnosis present

## 2017-12-31 DIAGNOSIS — R05 Cough: Secondary | ICD-10-CM | POA: Diagnosis not present

## 2017-12-31 DIAGNOSIS — J9621 Acute and chronic respiratory failure with hypoxia: Secondary | ICD-10-CM | POA: Diagnosis present

## 2017-12-31 DIAGNOSIS — R1311 Dysphagia, oral phase: Secondary | ICD-10-CM | POA: Diagnosis present

## 2017-12-31 DIAGNOSIS — R0602 Shortness of breath: Secondary | ICD-10-CM | POA: Diagnosis not present

## 2017-12-31 DIAGNOSIS — Z7982 Long term (current) use of aspirin: Secondary | ICD-10-CM

## 2017-12-31 DIAGNOSIS — R0603 Acute respiratory distress: Secondary | ICD-10-CM | POA: Diagnosis not present

## 2017-12-31 LAB — CBC WITH DIFFERENTIAL/PLATELET
Abs Immature Granulocytes: 0 10*3/uL (ref 0.0–0.1)
BASOS PCT: 1 %
Basophils Absolute: 0 10*3/uL (ref 0.0–0.1)
Eosinophils Absolute: 0.2 10*3/uL (ref 0.0–0.7)
Eosinophils Relative: 3 %
HEMATOCRIT: 39.3 % (ref 39.0–52.0)
Hemoglobin: 12.5 g/dL — ABNORMAL LOW (ref 13.0–17.0)
IMMATURE GRANULOCYTES: 0 %
LYMPHS ABS: 1.5 10*3/uL (ref 0.7–4.0)
Lymphocytes Relative: 27 %
MCH: 29.1 pg (ref 26.0–34.0)
MCHC: 31.8 g/dL (ref 30.0–36.0)
MCV: 91.4 fL (ref 78.0–100.0)
MONOS PCT: 17 %
Monocytes Absolute: 0.9 10*3/uL (ref 0.1–1.0)
NEUTROS PCT: 52 %
Neutro Abs: 2.9 10*3/uL (ref 1.7–7.7)
PLATELETS: 142 10*3/uL — AB (ref 150–400)
RBC: 4.3 MIL/uL (ref 4.22–5.81)
RDW: 12.5 % (ref 11.5–15.5)
WBC: 5.5 10*3/uL (ref 4.0–10.5)

## 2017-12-31 LAB — I-STAT ARTERIAL BLOOD GAS, ED
Acid-Base Excess: 4 mmol/L — ABNORMAL HIGH (ref 0.0–2.0)
Bicarbonate: 28.6 mmol/L — ABNORMAL HIGH (ref 20.0–28.0)
O2 Saturation: 95 %
PCO2 ART: 43 mmHg (ref 32.0–48.0)
TCO2: 30 mmol/L (ref 22–32)
pH, Arterial: 7.43 (ref 7.350–7.450)
pO2, Arterial: 72 mmHg — ABNORMAL LOW (ref 83.0–108.0)

## 2017-12-31 LAB — COMPREHENSIVE METABOLIC PANEL
ALT: 21 U/L (ref 0–44)
ANION GAP: 10 (ref 5–15)
AST: 25 U/L (ref 15–41)
Albumin: 3.4 g/dL — ABNORMAL LOW (ref 3.5–5.0)
Alkaline Phosphatase: 43 U/L (ref 38–126)
BUN: 10 mg/dL (ref 8–23)
CHLORIDE: 101 mmol/L (ref 98–111)
CO2: 29 mmol/L (ref 22–32)
Calcium: 8.8 mg/dL — ABNORMAL LOW (ref 8.9–10.3)
Creatinine, Ser: 0.74 mg/dL (ref 0.61–1.24)
Glucose, Bld: 105 mg/dL — ABNORMAL HIGH (ref 70–99)
Potassium: 3.8 mmol/L (ref 3.5–5.1)
SODIUM: 140 mmol/L (ref 135–145)
Total Bilirubin: 0.7 mg/dL (ref 0.3–1.2)
Total Protein: 6.8 g/dL (ref 6.5–8.1)

## 2017-12-31 LAB — URINALYSIS, ROUTINE W REFLEX MICROSCOPIC
BILIRUBIN URINE: NEGATIVE
Glucose, UA: NEGATIVE mg/dL
Hgb urine dipstick: NEGATIVE
KETONES UR: NEGATIVE mg/dL
LEUKOCYTES UA: NEGATIVE
NITRITE: NEGATIVE
PH: 6 (ref 5.0–8.0)
Protein, ur: NEGATIVE mg/dL
Specific Gravity, Urine: >1.046 — ABNORMAL HIGH (ref 1.005–1.030)

## 2017-12-31 LAB — I-STAT TROPONIN, ED: Troponin i, poc: 0 ng/mL (ref 0.00–0.08)

## 2017-12-31 LAB — MRSA PCR SCREENING: MRSA by PCR: NEGATIVE

## 2017-12-31 LAB — I-STAT CG4 LACTIC ACID, ED: Lactic Acid, Venous: 0.88 mmol/L (ref 0.5–1.9)

## 2017-12-31 LAB — PROCALCITONIN

## 2017-12-31 MED ORDER — SODIUM CHLORIDE 0.9 % IV BOLUS
500.0000 mL | Freq: Once | INTRAVENOUS | Status: AC
  Administered 2017-12-31: 500 mL via INTRAVENOUS

## 2017-12-31 MED ORDER — METHYLPREDNISOLONE SODIUM SUCC 125 MG IJ SOLR
125.0000 mg | Freq: Once | INTRAMUSCULAR | Status: AC
  Administered 2017-12-31: 125 mg via INTRAVENOUS
  Filled 2017-12-31: qty 2

## 2017-12-31 MED ORDER — KETOROLAC TROMETHAMINE 15 MG/ML IJ SOLN: 15.0000 mg | Freq: Four times a day (QID) | INTRAMUSCULAR | Status: DC | PRN

## 2017-12-31 MED ORDER — SODIUM CHLORIDE 0.9 % IV SOLN
INTRAVENOUS | Status: DC
  Administered 2017-12-31: 14:00:00 via INTRAVENOUS

## 2017-12-31 MED ORDER — RILUZOLE 50 MG PO TABS
50.0000 mg | ORAL_TABLET | Freq: Two times a day (BID) | ORAL | Status: DC
  Administered 2018-01-01 – 2018-01-02 (×2): 50 mg via ORAL
  Filled 2017-12-31 (×2): qty 1

## 2017-12-31 MED ORDER — IPRATROPIUM-ALBUTEROL 0.5-2.5 (3) MG/3ML IN SOLN
3.0000 mL | Freq: Once | RESPIRATORY_TRACT | Status: AC
  Administered 2017-12-31: 3 mL via RESPIRATORY_TRACT
  Filled 2017-12-31: qty 3

## 2017-12-31 MED ORDER — ACETAMINOPHEN 650 MG RE SUPP: 650.0000 mg | Freq: Four times a day (QID) | RECTAL | Status: DC | PRN

## 2017-12-31 MED ORDER — OXYCODONE HCL 5 MG PO TABS: 5.0000 mg | ORAL_TABLET | ORAL | Status: DC | PRN

## 2017-12-31 MED ORDER — ONDANSETRON HCL 4 MG/2ML IJ SOLN: 4.0000 mg | Freq: Four times a day (QID) | INTRAMUSCULAR | Status: DC | PRN

## 2017-12-31 MED ORDER — SODIUM CHLORIDE 0.9 % IV SOLN
500.0000 mg | INTRAVENOUS | Status: DC
  Administered 2017-12-31 – 2018-01-02 (×3): 500 mg via INTRAVENOUS
  Filled 2017-12-31 (×3): qty 500

## 2017-12-31 MED ORDER — SODIUM CHLORIDE 0.9 % IV SOLN
1.5000 g | Freq: Four times a day (QID) | INTRAVENOUS | Status: DC
  Administered 2017-12-31 – 2018-01-02 (×9): 1.5 g via INTRAVENOUS
  Filled 2017-12-31 (×12): qty 1.5

## 2017-12-31 MED ORDER — ACETAMINOPHEN 325 MG PO TABS: 650.0000 mg | ORAL_TABLET | Freq: Four times a day (QID) | ORAL | Status: DC | PRN

## 2017-12-31 MED ORDER — ONDANSETRON HCL 4 MG PO TABS: 4.0000 mg | ORAL_TABLET | Freq: Four times a day (QID) | ORAL | Status: DC | PRN

## 2017-12-31 MED ORDER — IOPAMIDOL (ISOVUE-370) INJECTION 76%
INTRAVENOUS | Status: AC
  Administered 2017-12-31: 80 mL
  Filled 2017-12-31: qty 100

## 2017-12-31 MED ORDER — ENOXAPARIN SODIUM 40 MG/0.4ML ~~LOC~~ SOLN
40.0000 mg | SUBCUTANEOUS | Status: DC
  Administered 2017-12-31 – 2018-01-01 (×2): 40 mg via SUBCUTANEOUS
  Filled 2017-12-31 (×2): qty 0.4

## 2017-12-31 NOTE — Progress Notes (Signed)
RT placed patient on BIPAP. Patient is tolerating well at this time. RN is aware.

## 2017-12-31 NOTE — Progress Notes (Signed)
Patient transported to 2W16 on Bipap without complications.

## 2017-12-31 NOTE — ED Notes (Signed)
CCM at bedside discussing plan of care with patient and family.

## 2017-12-31 NOTE — ED Triage Notes (Signed)
Patient reports worsening SOB with dry cough/chest congestion  onset yesterday , denies chest pain  , no fever or chills .

## 2017-12-31 NOTE — Plan of Care (Signed)
Patient arrived to 26w16 with wife.

## 2017-12-31 NOTE — Progress Notes (Signed)
RT note: NIF -20 with good patient effort.

## 2017-12-31 NOTE — H&P (Signed)
History and Physical    Albert Leonard QIO:[id number] DOB: November 12, 1945 DOA: 12/31/2017  PCP: Seward Carol, MD Patient coming from: home  Chief Complaint: Shortness of breath  HPI: Albert Leonard is a 72 y.o. 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  Pt's wife reports pt was diagnosed with a URI a week ago.  He has been on amoxicillin.  Pt has had increasing shortness of breath.  Pt has weakness in his upper arms from his ALS.  He is still able to ambulate with some weakness.      ED Course: Pt presented to the ED with shortness of breath and cough.  Pt has been on amoxicillin for URI.  Pt had a ct angio of his chest which showed volume loss in lower left lobe and opacification of much of bronchial tree serving left lowr lobe.  Pt was placed on bipap in ED and started on IV antibiotics.  Pulmonary Critical care was consulted and have advised continuing bipap. Bronchodilators and Iv antibiotics.   Pt does not want intubation or chest compressions.    Review of Systems: Review of Systems  Unable to perform ROS: Severe respiratory distress     Ambulatory Status: weakness,   Past Medical History:  Diagnosis Date  . 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 Medical history non-contributory     Past Surgical History:  Procedure Laterality Date  . COLONOSCOPY WITH PROPOFOL N/A 02/16/2013   Procedure: COLONOSCOPY WITH PROPOFOL;  Surgeon: Garlan Fair, MD;  Location: [facility];  Service: Endoscopy;  Laterality: N/A;  . colonscopy    . UPPER GI ENDOSCOPY  2013    Social History   Socioeconomic History  . Marital status: Married    Spouse name: Not on file  . Number of children: Not on file  . Years of education: Not on file  . Highest education level: Not on file  Occupational History  . Not on file  Social Needs  . Financial resource strain: Not on file  . Food insecurity:    Worry: Not on file    Inability: Not on file  . Transportation needs:   Medical: Not on file    Non-medical: Not on file  Tobacco Use  . Smoking status: Never Smoker  . Smokeless tobacco: Never Used  Substance and Sexual Activity  . Alcohol use: No  . Drug use: No  . Sexual activity: Not on file  Lifestyle  . Physical activity:    Days per week: Not on file    Minutes per session: Not on file  . Stress: Not on file  Relationships  . Social connections:    Talks on phone: Not on file    Gets together: Not on file    Attends religious service: Not on file    Active member of club or organization: Not on file    Attends meetings of clubs or organizations: Not on file    Relationship status: Not on file  . Intimate partner violence:    Fear of current or ex partner: Not on file    Emotionally abused: Not on file    Physically abused: Not on file    Forced sexual activity: Not on file  Other Topics Concern  . Not on file  Social History Narrative  . Not on file    No Known Allergies  History reviewed. No pertinent family history.  Prior to Admission medications   Medication Sig Start Date End  Date Taking? Authorizing Provider  folic acid (FOLVITE) 673 MCG tablet Take 400 mcg by mouth daily at 12 noon.   Yes [provider]  riluzole (RILUTEK) 50 MG tablet Take 50 mg by mouth every 12 (twelve) hours.   Yes [provider]  saw palmetto 160 MG capsule Take 160 mg by mouth daily.     [provider]  TURMERIC PO Take 1 tablet by mouth daily.    [provider]    Physical Exam: Vitals:   12/31/17 0945 12/31/17 1000 12/31/17 1100 12/31/17 1116  BP: 98/72 93/71 (!) 87/65 (!) 87/65  Pulse: 70 75 70 73  Resp: (!) 31 (!) 26 (!) 43 (!) 25  Temp:      TempSrc:      SpO2: 95% 98% 96% 100%     General:  Appears calm and comfortable Eyes:  PERRL, EOMI, normal lids, iris ENT:  grossly normal hearing, lips & tongue, mmm Neck:  no LAD, masses or thyromegaly Cardiovascular:  RRR, no m/r/g. No LE edema.    Respiratory: Bipap,  Abdomen:  soft, ntnd, NABS Skin:  no rash or induration seen on limited exam Musculoskeletal: weakness arms  Psychiatric:  grossly normal mood and affect, speech fluent and appropriate, AOx3 Neurologic:  CN 2-12 grossly intact, moves all extremities in coordinated fashion, sensation intact  Labs on Admission: I have personally reviewed following labs and imaging studies  CBC: Recent Labs  Lab 12/31/17 0701  WBC 5.5  NEUTROABS 2.9  HGB 12.5*  HCT 39.3  MCV 91.4  PLT 419*   Basic Metabolic Panel: Recent Labs  Lab 12/31/17 0701  NA 140  K 3.8  CL 101  CO2 29  GLUCOSE 105*  BUN 10  CREATININE 0.74  CALCIUM 8.8*   GFR: CrCl cannot be calculated (Unknown ideal weight.). Liver Function Tests: Recent Labs  Lab 12/31/17 0701  AST 25  ALT 21  ALKPHOS 43  BILITOT 0.7  PROT 6.8  ALBUMIN 3.4*   No results for input(s): LIPASE, AMYLASE in the last 168 hours. No results for input(s): AMMONIA in the last 168 hours. Coagulation Profile: No results for input(s): INR, PROTIME in the last 168 hours. Cardiac Enzymes: No results for input(s): CKTOTAL, CKMB, CKMBINDEX, TROPONINI in the last 168 hours. BNP (last 3 results) No results for input(s): PROBNP in the last 8760 hours. HbA1C: No results for input(s): HGBA1C in the last 72 hours. CBG: No results for input(s): GLUCAP in the last 168 hours. Lipid Profile: No results for input(s): CHOL, HDL, LDLCALC, TRIG, CHOLHDL, LDLDIRECT in the last 72 hours. Thyroid Function Tests: No results for input(s): TSH, T4TOTAL, FREET4, T3FREE, THYROIDAB in the last 72 hours. Anemia Panel: No results for input(s): VITAMINB12, FOLATE, FERRITIN, TIBC, IRON, RETICCTPCT in the last 72 hours. Urine analysis:    Component Value Date/Time   COLORURINE AMBER (A) 08/05/2015 1206   APPEARANCEUR CLOUDY (A) 08/05/2015 1206   LABSPEC 1.025 08/05/2015 1206   PHURINE 5.0 08/05/2015 1206   GLUCOSEU NEGATIVE 08/05/2015 1206    HGBUR NEGATIVE 08/05/2015 1206   BILIRUBINUR NEGATIVE 08/05/2015 1206   KETONESUR NEGATIVE 08/05/2015 1206   PROTEINUR 30 (A) 08/05/2015 1206   NITRITE NEGATIVE 08/05/2015 1206   LEUKOCYTESUR NEGATIVE 08/05/2015 1206    Creatinine Clearance: CrCl cannot be calculated (Unknown ideal weight.).  Sepsis Labs: @LABRCNTIP (procalcitonin:4,lacticidven:4) )No results found for this or any previous visit (from the past 240 hour(s)).   Radiological Exams on Admission: Ct Angio Chest Pe W/cm &/or  Wo Cm  Result Date: 12/31/2017 CLINICAL DATA:  Worsening shortness of breath. EXAM: CT ANGIOGRAPHY CHEST WITH CONTRAST TECHNIQUE: Multidetector CT imaging of the chest was performed using the standard protocol during bolus administration of intravenous contrast. Multiplanar CT image reconstructions and MIPs were obtained to evaluate the vascular anatomy. CONTRAST:  72mL ISOVUE-370 IOPAMIDOL (ISOVUE-370) INJECTION 76% COMPARISON:  Radiography same day. FINDINGS: Cardiovascular: Pulmonary arterial opacification is excellent. There are no pulmonary emboli. There is aortic atherosclerosis but no sign of aneurysm or dissection. Heart size is normal. No pericardial fluid. Mediastinum/Nodes: There are enlarged nodes in the left hilum. No central mediastinal were paratracheal nodes. Lungs/Pleura: No pleural fluid on the right. Minimal atelectasis at the posterior inferior right lower lobe. Right lung otherwise clear. On the left, there are abnormal findings in the left lower lobe. The left lower lobe bronchus is occluded as is much of the more distal left lower lobe bronchial tree. This could be due to a central obstructing lesion or to lower lobe pneumonia. There is fairly mild volume loss and patchy density within the left lower lobe lung parenchyma. No pleural fluid on the left. Upper Abdomen: Negative Musculoskeletal: Thoracic curvature convex to the right. No acute bone finding. Review of the MIP images confirms the  above findings. IMPRESSION: Volume loss in the left lower lobe with some patchy parenchymal density. Opacification of much of the bronchial tree serving the left lower lobe. The findings could be due to bronchitis/pneumonia with associated atelectasis. I cannot rule out an endobronchial lesion at this time. Follow-up recommended. No pulmonary emboli. Aortic Atherosclerosis (ICD10-I70.0). Electronically Signed   By: Nelson Chimes M.D.   On: 12/31/2017 10:14   Dg Chest Port 1 View  Result Date: 12/31/2017 CLINICAL DATA:  Shortness of breath with cough/congestion since yesterday. EXAM: PORTABLE CHEST 1 VIEW COMPARISON:  10/03/2017 FINDINGS: Lungs are adequately inflated without consolidation or effusion. Cardiomediastinal silhouette and remainder the exam is unchanged. IMPRESSION: No active disease. Electronically Signed   By: Marin Olp M.D.   On: 12/31/2017 07:11    EKG: Independently reviewed.   Assessment/Plan    1) Respiratory Failure/Respiratory Distress Pt on Bipap.  Critical care has recommended pt stay on bipap with breaks. Pt given IV unasyn and IV azithromycin  Albuterol nebulization Respiratory therapy consulted for Chest pt   2) Pneumonia  Left lower lobe Antibiotics as above  3) Possible Endobronchial Lesion Pulmonary  Critical has recommended repeat outpatient ct after resolution of currnt illness  4) ALS  Continue respiratory Continue Rilutek  support   DVT prophylaxis: Lovenox Code Status: DNR  bipap okay Family Communication: Wife at bedside  Disposition Plan: Admission Consults called: Critical care has consulted.  See note from [facility].   Admission status: INpatient   Alyse Low PA-C [facility]  If 7PM-7AM, please contact night-coverage [email or web address] Password [facility]  12/31/2017, 11:34 AM

## 2017-12-31 NOTE — ED Provider Notes (Signed)
[facility] EMERGENCY DEPARTMENT Provider Note   CSN: [id number] Arrival date & time: 12/31/17  0542     History   Chief Complaint Chief Complaint  Patient presents with  . Shortness of Breath    HPI Albert Leonard is a 72 y.o. male with a history of ALS, who presents today for evaluation of worsening shortness of breath.  He was seen at urgent care earlier in the week and diagnosed with a sinus infection and started on Augmentin.  Since then he has continued to worsen.  History is provided by patient, his wife, and chart review.  Wife reports that normally he uses pursed lip breathing, and his respiratory status has been gradually worsening, however over the past few days it has become significantly worse.  He has been using his CoughAssist machine at home, along with BiPAP without significant improvement.  He does not take any breathing treatments.  He denies any falls or trauma recently.  His wife reports that they could hear his chest rattling.   HPI  Past Medical History:  Diagnosis Date  . 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 Medical history non-contributory     There are no active problems to display for this patient.   Past Surgical History:  Procedure Laterality Date  . COLONOSCOPY WITH PROPOFOL N/A 02/16/2013   Procedure: COLONOSCOPY WITH PROPOFOL;  Surgeon: Garlan Fair, MD;  Location: [facility];  Service: Endoscopy;  Laterality: N/A;  . colonscopy    . UPPER GI ENDOSCOPY  2013        Home Medications    Prior to Admission medications   Medication Sig Start Date End Date Taking? Authorizing Provider  Ascorbic Acid (VITAMIN C PO) Take 1 tablet by mouth daily.    [provider]  aspirin EC 81 MG tablet Take 81 mg by mouth daily.    [provider]  Diclofenac Sodium CR (VOLTAREN-XR) 100 MG 24 hr tablet Take 1 tablet (100 mg total) by mouth daily. 10/04/17   Palumbo, April, MD  Multiple Vitamin (MULTIVITAMIN WITH MINERALS) TABS  tablet Take 1 tablet by mouth daily.    [provider]  polycarbophil (FIBERCON) 625 MG tablet Take 1,250 mg by mouth daily.     [provider]  riluzole (RILUTEK) 50 MG tablet Take 50 mg by mouth every 12 (twelve) hours.    [provider]  saw palmetto 160 MG capsule Take 160 mg by mouth daily.     [provider]  TURMERIC PO Take 1 tablet by mouth daily.    [provider]    Family History History reviewed. No pertinent family history.  Social History Social History   Tobacco Use  . Smoking status: Never Smoker  . Smokeless tobacco: Never Used  Substance Use Topics  . Alcohol use: No  . Drug use: No     Allergies   Patient has no known allergies.   Review of Systems Review of Systems  Constitutional: Negative for chills and fever.  HENT: Positive for congestion, sinus pressure and sinus pain.   Respiratory: Positive for cough, chest tightness and shortness of breath.   Cardiovascular: Negative for chest pain, palpitations and leg swelling.  Gastrointestinal: Negative for abdominal pain, diarrhea, nausea and vomiting.  Neurological: Positive for weakness (Unchanged from baseline).  All other systems reviewed and are negative.    Physical Exam Updated Vital Signs BP 108/81   Pulse 78   Temp 98.2 F (36.8 C) (Oral)  Resp (!) 22   SpO2 92%   Physical Exam  Constitutional: He appears ill.  HENT:  Head: Normocephalic and atraumatic.  Eyes: Conjunctivae are normal.  Neck: Normal range of motion. Neck supple.  Cardiovascular: Normal rate and regular rhythm.  No murmur heard. Pulmonary/Chest: Accessory muscle usage present. Tachypnea noted. He is in respiratory distress (Moderate, pursed lip breathing). He has decreased breath sounds in the left upper field, the left middle field and the left lower field.  Abdominal: Soft. There is no tenderness.  Musculoskeletal: He exhibits no edema.  Neurological: He is alert.    Skin: Skin is warm and dry.  Psychiatric: He has a normal mood and affect.  Nursing note and vitals reviewed.    ED Treatments / Results  Labs (all labs ordered are listed, but only abnormal results are displayed) Labs Reviewed  COMPREHENSIVE METABOLIC PANEL - Abnormal; Notable for the following components:      Result Value   Glucose, Bld 105 (*)    Calcium 8.8 (*)    Albumin 3.4 (*)    All other components within normal limits  CBC WITH DIFFERENTIAL/PLATELET - Abnormal; Notable for the following components:   Hemoglobin 12.5 (*)    Platelets 142 (*)    All other components within normal limits  URINALYSIS, ROUTINE W REFLEX MICROSCOPIC - Abnormal; Notable for the following components:   Specific Gravity, Urine >1.046 (*)    All other components within normal limits  CBC - Abnormal; Notable for the following components:   RBC 3.93 (*)    Hemoglobin 11.6 (*)    HCT 35.4 (*)    Platelets 140 (*)    All other components within normal limits  BASIC METABOLIC PANEL - Abnormal; Notable for the following components:   Glucose, Bld 107 (*)    Calcium 8.5 (*)    All other components within normal limits  I-STAT ARTERIAL BLOOD GAS, ED - Abnormal; Notable for the following components:   pO2, Arterial 72.0 (*)    Bicarbonate 28.6 (*)    Acid-Base Excess 4.0 (*)    All other components within normal limits  MRSA PCR SCREENING  PROCALCITONIN  PROCALCITONIN  I-STAT TROPONIN, ED  I-STAT CG4 LACTIC ACID, ED  I-STAT CG4 LACTIC ACID, ED    EKG EKG Interpretation  Date/Time:  Wednesday December 31 2017 05:51:10 EDT Ventricular Rate:  87 PR Interval:  160 QRS Duration: 70 QT Interval:  366 QTC Calculation: 440 R Axis:   62 Text Interpretation:  Normal sinus rhythm Confirmed by Randal Buba, April ([id number]) on 12/31/2017 6:42:17 AM   Radiology Ct Angio Chest Pe W/cm &/or Wo Cm  Result Date: 12/31/2017 CLINICAL DATA:  Worsening shortness of breath. EXAM: CT ANGIOGRAPHY CHEST WITH  CONTRAST TECHNIQUE: Multidetector CT imaging of the chest was performed using the standard protocol during bolus administration of intravenous contrast. Multiplanar CT image reconstructions and MIPs were obtained to evaluate the vascular anatomy. CONTRAST:  63mL ISOVUE-370 IOPAMIDOL (ISOVUE-370) INJECTION 76% COMPARISON:  Radiography same day. FINDINGS: Cardiovascular: Pulmonary arterial opacification is excellent. There are no pulmonary emboli. There is aortic atherosclerosis but no sign of aneurysm or dissection. Heart size is normal. No pericardial fluid. Mediastinum/Nodes: There are enlarged nodes in the left hilum. No central mediastinal were paratracheal nodes. Lungs/Pleura: No pleural fluid on the right. Minimal atelectasis at the posterior inferior right lower lobe. Right lung otherwise clear. On the left, there are abnormal findings in the left lower lobe. The left lower lobe bronchus is occluded  as is much of the more distal left lower lobe bronchial tree. This could be due to a central obstructing lesion or to lower lobe pneumonia. There is fairly mild volume loss and patchy density within the left lower lobe lung parenchyma. No pleural fluid on the left. Upper Abdomen: Negative Musculoskeletal: Thoracic curvature convex to the right. No acute bone finding. Review of the MIP images confirms the above findings. IMPRESSION: Volume loss in the left lower lobe with some patchy parenchymal density. Opacification of much of the bronchial tree serving the left lower lobe. The findings could be due to bronchitis/pneumonia with associated atelectasis. I cannot rule out an endobronchial lesion at this time. Follow-up recommended. No pulmonary emboli. Aortic Atherosclerosis (ICD10-I70.0). Electronically Signed   By: Nelson Chimes M.D.   On: 12/31/2017 10:14   Portable Chest 1 View  Result Date: 01/01/2018 CLINICAL DATA:  Shortness of breath EXAM: PORTABLE CHEST 1 VIEW COMPARISON:  12/31/2017 FINDINGS: Mild left  base atelectasis or infiltrate, similar prior study. Right lung clear. Heart is normal size. No effusions or acute bony abnormality. IMPRESSION: Continued left base atelectasis or infiltrate. Electronically Signed   By: Rolm Baptise M.D.   On: 01/01/2018 08:08   Dg Chest Port 1 View  Result Date: 12/31/2017 CLINICAL DATA:  Shortness of breath with cough/congestion since yesterday. EXAM: PORTABLE CHEST 1 VIEW COMPARISON:  10/03/2017 FINDINGS: Lungs are adequately inflated without consolidation or effusion. Cardiomediastinal silhouette and remainder the exam is unchanged. IMPRESSION: No active disease. Electronically Signed   By: Marin Olp M.D.   On: 12/31/2017 07:11    Procedures Procedures (including critical care time) CRITICAL CARE Performed by: Wyn Quaker Total critical care time: 30 minutes Critical care time was exclusive of separately billable procedures and treating other patients. Critical care was necessary to treat or prevent imminent or life-threatening deterioration. Critical care was time spent personally by me on the following activities: development of treatment plan with patient and/or surrogate as well as nursing, discussions with consultants, evaluation of patient's response to treatment, examination of patient, obtaining history from patient or surrogate, ordering and performing treatments and interventions, ordering and review of laboratory studies, ordering and review of radiographic studies, pulse oximetry and re-evaluation of patient's condition.   Medications Ordered in ED Medications    ampicillin-sulbactam (UNASYN) 1.5 g in sodium chloride 0.9 % 100 mL IVPB (1.5 g Intravenous New Bag/Given 01/01/18 1446)  ipratropium-albuterol (DUONEB) 0.5-2.5 (3) MG/3ML nebulizer solution 3 mL (3 mLs Nebulization Given 12/31/17 0651)  methylPREDNISolone sodium succinate (SOLU-MEDROL) 125 mg/2 mL injection 125 mg (125 mg Intravenous Given 12/31/17 0715)  sodium chloride 0.9 %  bolus 500 mL (0 mLs Intravenous Stopped 12/31/17 0912)  iopamidol (ISOVUE-370) 76 % injection (80 mLs  Contrast Given 12/31/17 0945)       Initial Impression / Assessment and Plan / ED Course  I have reviewed the triage vital signs and the nursing notes.  Pertinent labs & imaging results that were available during my care of the patient were reviewed by me and considered in my medical decision making (see chart for details).  Clinical Course as of Jan 02 1611  Wed Dec 31, 2017  9983 Paged respiratory.  They are aware patient needs BiPAP.   [EH]  3825 pO2, Arterial(!): 72.0 [EH]  0539 Spoke with patient who verified Limited code preferences.  Dr. Randal Buba also heard him state he would not want intubation.    [EH]  [phone] Spoke with critical care who will come see  patient.    [EH]  0845 Patient reevaluated, states that he is feeling better at this time.  Critical care in the room to evaluate patient.   [EH]  50 Spoke with APP from critical care, made him aware of CT angios PE results.   [EH]  1107 Spoke with APP from hosptialist who will come see patient.    [EH]    Clinical Course User Index [EH] Lorin Glass, PA-C   Patient presents today for evaluation of worsening shortness of breath in the setting of ALS with gradually worsening respiratory status.  On arrival he was in moderate respiratory distress with retractions and pursed lip breathing.  He was placed on BiPAP.  ABG was obtained showing hypoxemia with a PO2 of 72, and he was placed on supplemental oxygen.  Labs were obtained, he does not have a leukocytosis, his mildly anemic at hemoglobin of 12.5, and slightly thrombocytopenic with platelets of 142.  He has a normal creatinine and does not have any other significant electrolyte disturbances or derangements.  Chest x-ray was obtained without acute abnormalities.  Any acute worsening in his respiratory status, along with the relative immobility his at high risk for PE and  DVT.  CT angios PE study was performed showing concern for a left lower lung pneumonia without evidence of emboli.  Patient was seen by critical care.  Patient was started on IV antibiotics.  Hospitalist was consulted for admission who agreed to admit patient.  Code preferences were explicitly discussed with patient, his wife, and Dr. Randal Buba and updated in the chart to reflect his preferences.    Patient to be admitted by hospitalist.   Final Clinical Impressions(s) / ED Diagnoses   Final diagnoses:  Pneumonia    ED Discharge Orders    None       Ollen Gross 01/01/18 1617    Palumbo, April, MD 01/02/18 0020

## 2017-12-31 NOTE — Consult Note (Signed)
Albert Leonard  BWI:[id number] DOB: 07/02/1945 DOA: 12/31/2017 PCP: Seward Carol, MD    Reason for Consult/Chief Complaint:  Acute on chronic respiratory failure  Consulting MD:  Wyn Quaker, PA-C HPI/Brief Narrative   This is a 72 year old male patient with a history of ALS followed at [facility].  At baseline he has significant functional decline: Uses trilogy noninvasive ventilation several times a day, he is ambulatory however has limited deltoid strength (unable to raise upper arms against gravity), and most recent swallowing study at the [place] in [employer] demonstrates oral dysphasia requiring thickened liquids.  His most recent neurology visit at [facility] notes significant work of breathing even at baseline.  He had been scheduled for PEG placement this week, the week of 8/19 however this was placed on hold when he was diagnosed with a sinus infection and a local urgent care on 8/19.  Apparently over the weekend of 8/17 he began to develop sinus congestion, nonproductive cough, chest rattling, and increased postnasal drip.  This occurred shortly after his wife had similar symptoms.  She brought him to an urgent care where he was placed on amoxacillin, and given nasal steroids.  In spite of this therapy his respiratory status continued to decline.  The evening of 8/20 going into 8/21 his work of breathing worsened to the point that he was short of breath on his noninvasive ventilator and was unable to sleep.  Because of this he was brought to the emergency room for further evaluation.  On initial eval he was found to be in acute respiratory distress, this improved with application of noninvasive ventilation.  His initial chest x-ray was clear of infiltrate.  Pulmonary critical care was asked to evaluate given progressive acute on chronic respiratory failure  Assessment & Plan:  Acute on chronic hypoxic respiratory failure in the setting of probable  community-acquired versus aspiration pneumonia superimposed on underlying ALS -Recent URI prodrome/sick exposure  -Also known oral dysphasia  -he has significant neuromuscular weakness at baseline requiring noninvasive ventilation several hours of every day -Initial chest x-ray recently reviewed: Without infiltrate but suspect CT scan will be more telling Plan/recommendation Admit to stepdown unit Send urinary strep antigen IV Unasyn and azithromycin Scheduled bronchodilators Continue noninvasive ventilation Full DO NOT RESUSCITATE Patient's agreeable to hospital admission, however he would rather be home.  Would monitor progress closely.  He understands clinical course could decline in which case transition to palliative care may be necessary  Oral dysphasia Plan N.p.o. for now SLP bedside evaluation He is planned for PEG placement as outpatient  ALS Plan Supportive care  Anemia of chronic disease No evidence of bleeding Plan Intermittent CBC  Best practice/Goals of care/disposition.   DVT prophylaxis: Subcutaneous heparin GI prophylaxis: PPI Diet: N.p.o. for now Mobility: Bedrest for now Code Status: Full DO NOT RESUSCITATE.  Continue to use noninvasive ventilation PRN and at at bedtime Family Communication: Extensive family discussion with the patient, wife, and daughter at bedside.    Disposition/ summary of today's plan 12/31/17   Admit to stepdown unit.  Full DO NOT RESUSCITATE.  Treat as CAP versus aspiration.  BiPAP as needed.  Hopefully clinically this will declare itself in the next 24 to 48 hours.  Patient would like to limit length of hospital stay.  He understands clinically things could get worse.  We discussed transition to palliation should treatment fail.  Consultants:  Needs palliative care consult Pulmonary consulted 8/21 by ER Dr.  Procedures:  Significant diagnostic tests: CT angiogram pending/ 821:  Micro data: Urine strep antigen  8/21  Antimicrobials:  Unasyn 8/21 Augmentin 8/21   Subjective  Feels a little better on noninvasive ventilation  Objective    Blood pressure 104/84, pulse 75, temperature 98.2 F (36.8 C), temperature source Oral, resp. rate (Abnormal) 36, SpO2 99 %.       No intake or output data in the 24 hours ending 12/31/17 0910 There were no vitals filed for this visit.  Examination: General: Pleasant 72 year old African-American male currently tachypneic with mild accessory muscle use on noninvasive ventilation HENT: BiPAP mask in place.  No JVD sclera nonicteric mucous membranes moist Lungs: Diminished throughout with accessory use Cardiovascular: Regular rate and rhythm Abdomen: Soft nontender Extremities: Brisk cap refill no edema strong pulses warm to touch Neuro: Awake, alert, oriented x3.  Upper extremity strength limited.  Anterior deltoid raise unable to resist gravity lower extremity strength remains intact GU: Due to void  Labs   CBC: Recent Labs  Lab 12/31/17 0701  WBC 5.5  NEUTROABS 2.9  HGB 12.5*  HCT 39.3  MCV 91.4  PLT 301*   Basic Metabolic Panel: Recent Labs  Lab 12/31/17 0701  NA 140  K 3.8  CL 101  CO2 29  GLUCOSE 105*  BUN 10  CREATININE 0.74  CALCIUM 8.8*   GFR: CrCl cannot be calculated (Unknown ideal weight.). Recent Labs  Lab 12/31/17 0701 12/31/17 0837  WBC 5.5  --   LATICACIDVEN  --  0.88   Liver Function Tests: Recent Labs  Lab 12/31/17 0701  AST 25  ALT 21  ALKPHOS 43  BILITOT 0.7  PROT 6.8  ALBUMIN 3.4*   No results for input(s): LIPASE, AMYLASE in the last 168 hours. No results for input(s): AMMONIA in the last 168 hours. ABG    Component Value Date/Time   PHART 7.430 12/31/2017 0705   PCO2ART 43.0 12/31/2017 0705   PO2ART 72.0 (L) 12/31/2017 0705   HCO3 28.6 (H) 12/31/2017 0705   TCO2 30 12/31/2017 0705   O2SAT 95.0 12/31/2017 0705    Coagulation Profile: No results for input(s): INR, PROTIME in the last 168  hours. Cardiac Enzymes: No results for input(s): CKTOTAL, CKMB, CKMBINDEX, TROPONINI in the last 168 hours. HbA1C: No results found for: HGBA1C CBG: No results for input(s): GLUCAP in the last 168 hours.   Review of Systems:   General: Denies fever, chills.  Did have sick exposure.  HEENT sinus congestion positive.  Postnasal drip positive.  Sore throat positive.  Nonproductive cough positive.  Pulmonary: Increased shortness of breath, cough, nonproductive.  Some chest pain with short of breath.  No wheezing.  Cardiac: No chest pain other than what was above-mentioned.  No palpitations.  GI: Diet been regular.  Appetite's been acceptable.  No nausea vomiting diarrhea GU: No dysuria frequency or hesitancy.  Neuro: No change in baseline strength, however he does endorse he is fatigued musculoskeletal: At baseline no increasing weakness.  Past medical history   Past Medical History:  Diagnosis Date  . Leta [facility] disease [facility])   . Medical history non-contributory    Social History   Social History   Socioeconomic History  . Marital status: Married    Spouse name: Not on file  . Number of children: Not on file  . Years of education: Not on file  . Highest education level: Not on file  Occupational History  . Not on file  Social Needs  . Emergency planning/management officer  strain: Not on file  . Food insecurity:    Worry: Not on file    Inability: Not on file  . Transportation needs:    Medical: Not on file    Non-medical: Not on file  Tobacco Use  . Smoking status: Never Smoker  . Smokeless tobacco: Never Used  Substance and Sexual Activity  . Alcohol use: No  . Drug use: No  . Sexual activity: Not on file  Lifestyle  . Physical activity:    Days per week: Not on file    Minutes per session: Not on file  . Stress: Not on file  Relationships  . Social connections:    Talks on phone: Not on file    Gets together: Not on file    Attends religious service: Not on file    Active member  of club or organization: Not on file    Attends meetings of clubs or organizations: Not on file    Relationship status: Not on file  . Intimate partner violence:    Fear of current or ex partner: Not on file    Emotionally abused: Not on file    Physically abused: Not on file    Forced sexual activity: Not on file  Other Topics Concern  . Not on file  Social History Narrative  . Not on file    Family history   History reviewed. No pertinent family history.  Allergies No Known Allergies  Home meds  Prior to Admission medications   Medication Sig Start Date End Date Taking? Authorizing Provider  Ascorbic Acid (VITAMIN C PO) Take 1 tablet by mouth daily.    [provider]  aspirin EC 81 MG tablet Take 81 mg by mouth daily.    [provider]  Diclofenac Sodium CR (VOLTAREN-XR) 100 MG 24 hr tablet Take 1 tablet (100 mg total) by mouth daily. 10/04/17   Palumbo, April, MD  Multiple Vitamin (MULTIVITAMIN WITH MINERALS) TABS tablet Take 1 tablet by mouth daily.    [provider]  polycarbophil (FIBERCON) 625 MG tablet Take 1,250 mg by mouth daily.     [provider]  riluzole (RILUTEK) 50 MG tablet Take 50 mg by mouth every 12 (twelve) hours.    [provider]  saw palmetto 160 MG capsule Take 160 mg by mouth daily.     [provider]  TURMERIC PO Take 1 tablet by mouth daily.    [provider]     LOS: 0 days   Erick Colace ACNP-BC [place] Pager # [phone] OR # [phone] if no answer

## 2017-12-31 NOTE — Progress Notes (Addendum)
Patient transported to CT and back to G54 without complications.

## 2017-12-31 NOTE — ED Provider Notes (Signed)
Medical screening examination/treatment/procedure(s) were conducted as a shared visit with non-physician practitioner(s) and myself.  I personally evaluated the patient during the encounter.  EKG Interpretation  Date/Time:  Wednesday December 31 2017 05:51:10 EDT Ventricular Rate:  87 PR Interval:  160 QRS Duration: 70 QT Interval:  366 QTC Calculation: 440 R Axis:   62 Text Interpretation:  Normal sinus rhythm Confirmed by Randal Buba, Jodette Wik ([id number]) on 12/31/2017 6:42:17 AM   Seen and examined   NCAT PERRL RRR diminished with shallow respirations NABS FROM, no edema  Plan bipap and admit    Aris Moman, MD 12/31/17 2330

## 2017-12-31 NOTE — ED Notes (Signed)
Pt informed of the need of a ua sample

## 2018-01-01 ENCOUNTER — Inpatient Hospital Stay (HOSPITAL_COMMUNITY): Payer: Medicare Other

## 2018-01-01 DIAGNOSIS — J181 Lobar pneumonia, unspecified organism: Principal | ICD-10-CM

## 2018-01-01 DIAGNOSIS — R0603 Acute respiratory distress: Secondary | ICD-10-CM

## 2018-01-01 DIAGNOSIS — G1221 Amyotrophic lateral sclerosis: Secondary | ICD-10-CM

## 2018-01-01 DIAGNOSIS — R627 Adult failure to thrive: Secondary | ICD-10-CM

## 2018-01-01 LAB — BASIC METABOLIC PANEL
Anion gap: 7 (ref 5–15)
BUN: 8 mg/dL (ref 8–23)
CHLORIDE: 103 mmol/L (ref 98–111)
CO2: 28 mmol/L (ref 22–32)
CREATININE: 0.65 mg/dL (ref 0.61–1.24)
Calcium: 8.5 mg/dL — ABNORMAL LOW (ref 8.9–10.3)
Glucose, Bld: 107 mg/dL — ABNORMAL HIGH (ref 70–99)
POTASSIUM: 3.8 mmol/L (ref 3.5–5.1)
SODIUM: 138 mmol/L (ref 135–145)

## 2018-01-01 LAB — CBC
HCT: 35.4 % — ABNORMAL LOW (ref 39.0–52.0)
HEMOGLOBIN: 11.6 g/dL — AB (ref 13.0–17.0)
MCH: 29.5 pg (ref 26.0–34.0)
MCHC: 32.8 g/dL (ref 30.0–36.0)
MCV: 90.1 fL (ref 78.0–100.0)
PLATELETS: 140 10*3/uL — AB (ref 150–400)
RBC: 3.93 MIL/uL — AB (ref 4.22–5.81)
RDW: 12.1 % (ref 11.5–15.5)
WBC: 8.9 10*3/uL (ref 4.0–10.5)

## 2018-01-01 LAB — PROCALCITONIN

## 2018-01-01 MED ORDER — ALPRAZOLAM 0.25 MG PO TABS
ORAL_TABLET | ORAL | Status: AC
  Filled 2018-01-01: qty 1

## 2018-01-01 MED ORDER — TRAZODONE HCL 50 MG PO TABS
25.0000 mg | ORAL_TABLET | Freq: Once | ORAL | Status: AC
  Administered 2018-01-01: 25 mg via ORAL
  Filled 2018-01-01: qty 1

## 2018-01-01 MED ORDER — ALPRAZOLAM 0.25 MG PO TABS
0.2500 mg | ORAL_TABLET | Freq: Once | ORAL | Status: AC
  Administered 2018-01-01: 0.25 mg via ORAL

## 2018-01-01 NOTE — Progress Notes (Signed)
PROGRESS NOTE  Fields Oros OHY:[id number] DOB: 07/26/1945 DOA: 12/31/2017 PCP: Seward Carol, MD  HPI/Recap of past 24 hours:  Using accessory muscle  No cough, no fever, denies pain Wife at bedside  Assessment/Plan: Active Problems:   Pneumonia  Progressive respiratory failure/distress -patient has underline ALS, he is  Recently started on home trilogy vent due to progressive sob (patient only use it during day time, he can not tolerated it at night_ -he was  Recently diagnosed with sinus infection, he presented to the ED on 8/21 due to increased sob with dry cough/chest congestion -he has delayed his peg placement due to recent infection -CTA on presentation negative for PE "Volume loss in the left lower lobe with some patchy parenchymal density. Opacification of much of the bronchial tree serving the left lower lobe. The findings could be due to bronchitis/pneumonia with associated atelectasis. I cannot rule out an endobronchial lesion at this time. Follow-up recommended." -Repeat cxr "Continued left base atelectasis or infiltrate" -he is started on unasyn due to concerning for aspiration pneumonia -he is on bipap here, pulmonology/critical care consulted  Currently npo, awaiting swallow eval   ALS Diagnosed in 2015 He has been on riluzole since  Wive reports patient has established care with  hospice, they want to preserve quality of life and keep going as long as possible, wife declined need of palliative care consult here  Possible endobronchial lesion  Outpatient follow up   Code Status: DNR  Family Communication: patient and wife  Disposition Plan: they wants to go home as early as possible Wife reports home health has been set up by [place]   Consultants:  Pulmonology/Critical care  Procedures:  bipap  Antibiotics:  Unasyn,   zithro   Objective: BP 108/87   Pulse 68   Temp 97.8 F (36.6 C) (Oral)   Resp (!) 23   Ht 6\' 2"  (1.88 m)   Wt  73.9 kg   SpO2 100%   BMI 20.92 kg/m   Intake/Output Summary (Last 24 hours) at 01/01/2018 1158 Last data filed at 12/31/2017 1802 Gross per 24 hour  Intake 594.48 ml  Output 100 ml  Net 494.48 ml   Filed Weights   12/31/17 1718  Weight: 73.9 kg    Exam: Patient is examined daily including today on 01/01/2018, exams remain the same as of yesterday except that has changed    General:  Using accessory muscle to breath, wife reports this has been going on for about 6 weeks  Cardiovascular: RRR  Respiratory: CTABL  Abdomen: Soft/ND/NT, positive BS  Musculoskeletal: No Edema  Neuro: alert, oriented , moving legs and arms  Data Reviewed: Basic Metabolic Panel: Recent Labs  Lab 12/31/17 0701 01/01/18 0303  NA 140 138  K 3.8 3.8  CL 101 103  CO2 29 28  GLUCOSE 105* 107*  BUN 10 8  CREATININE 0.74 0.65  CALCIUM 8.8* 8.5*   Liver Function Tests: Recent Labs  Lab 12/31/17 0701  AST 25  ALT 21  ALKPHOS 43  BILITOT 0.7  PROT 6.8  ALBUMIN 3.4*   No results for input(s): LIPASE, AMYLASE in the last 168 hours. No results for input(s): AMMONIA in the last 168 hours. CBC: Recent Labs  Lab 12/31/17 0701 01/01/18 0303  WBC 5.5 8.9  NEUTROABS 2.9  --   HGB 12.5* 11.6*  HCT 39.3 35.4*  MCV 91.4 90.1  PLT 142* 140*   Cardiac Enzymes:   No results for input(s): CKTOTAL, CKMB, CKMBINDEX, TROPONINI  in the last 168 hours. BNP (last 3 results) No results for input(s): BNP in the last 8760 hours.  ProBNP (last 3 results) No results for input(s): PROBNP in the last 8760 hours.  CBG: No results for input(s): GLUCAP in the last 168 hours.  Recent Results (from the past 240 hour(s))  MRSA PCR Screening     Status: None   Collection Time: 12/31/17  5:10 PM  Result Value Ref Range Status   MRSA by PCR NEGATIVE NEGATIVE Final    Comment:        The GeneXpert MRSA Assay (FDA approved for NASAL specimens only), is one component of a comprehensive MRSA  colonization surveillance program. It is not intended to diagnose MRSA infection nor to guide or monitor treatment for MRSA infections. Performed at [facility], [place] [address], [place], [place] [address]      Studies: Portable Chest 1 View  Result Date: 01/01/2018 CLINICAL DATA:  Shortness of breath EXAM: PORTABLE CHEST 1 VIEW COMPARISON:  12/31/2017 FINDINGS: Mild left base atelectasis or infiltrate, similar prior study. Right lung clear. Heart is normal size. No effusions or acute bony abnormality. IMPRESSION: Continued left base atelectasis or infiltrate. Electronically Signed   By: Rolm Baptise M.D.   On: 01/01/2018 08:08    Scheduled Meds: . ALPRAZolam      . enoxaparin (LOVENOX) injection  40 mg Subcutaneous Q24H  . riluzole  50 mg Oral Q12H    Continuous Infusions: . sodium chloride 100 mL/hr at 12/31/17 1404  . ampicillin-sulbactam (UNASYN) IV 1.5 g (01/01/18 0800)  . azithromycin 500 mg (01/01/18 0754)     Time spent: 1mins I have personally reviewed and interpreted on  01/01/2018 daily labs, tele strips, imagings as discussed above under date review session and assessment and plans.  I reviewed all nursing notes, pharmacy notes, consultant notes,  vitals, pertinent old records  I have discussed plan of care as described above with RN , patient and family on 01/01/2018   Florencia Reasons MD, PhD  [facility] Pager [phone]. If 7PM-7AM, please contact night-coverage at [email or web address], password [facility] 01/01/2018, 11:58 AM  LOS: 1 day

## 2018-01-01 NOTE — Progress Notes (Signed)
PT Cancellation Note  Patient Details Name: Albert Leonard MRN: [id number] DOB: December 23, 1945   Cancelled Treatment:    Reason Eval/Treat Not Completed: Medical issues which prohibited therapy.  Nursing became aware PT wanted to see pt and asked PT to hold the visit as pt was back on bipap and per nsg is very weak.  Try again at another time.   Ramond Dial 01/01/2018, 4:26 PM   Mee Hives, PT MS Acute Rehab Dept. Number: [place] and [place]

## 2018-01-01 NOTE — Progress Notes (Signed)
NIF -20 with good patient effort.

## 2018-01-01 NOTE — Progress Notes (Signed)
RT note-Patient taken off Bipap and placed on 3l/min [place]. NIF performed -20. Continue to monitor.

## 2018-01-01 NOTE — Progress Notes (Signed)
Patient resting well will continue CPT in AM.

## 2018-01-01 NOTE — Progress Notes (Signed)
Patient asleep resting well.  CPT not performed at this time.  Will continue CPT @ 0800.

## 2018-01-01 NOTE — Progress Notes (Signed)
eLink Physician-Brief Progress Note Patient Name: Keynan Heffern DOB: 1945/06/21 MRN: [id number]   Date of Service  01/01/2018  HPI/Events of Note  Anxiety  eICU Interventions  Will order: 1. Xanax 0.25 mg PO X 1 now.      Intervention Category Minor Interventions: Agitation / anxiety - evaluation and management  Alanea Woolridge Eugene 01/01/2018, 2:17 AM

## 2018-01-02 ENCOUNTER — Inpatient Hospital Stay (HOSPITAL_COMMUNITY): Payer: Medicare Other

## 2018-01-02 LAB — STREP PNEUMONIAE URINARY ANTIGEN: Strep Pneumo Urinary Antigen: NEGATIVE

## 2018-01-02 LAB — PROCALCITONIN: Procalcitonin: <0.1 ng/mL

## 2018-01-02 MED ORDER — AMOXICILLIN-POT CLAVULANATE 875-125 MG PO TABS: 1.0000 | ORAL_TABLET | Freq: Two times a day (BID) | ORAL | 0 refills | Status: AC

## 2018-01-02 NOTE — Progress Notes (Signed)
  Speech Language Pathology  Patient Details Name: Albert Leonard MRN: [id number] DOB: [birth year] Today's Date: 01/02/2018 Time:  -      MBS scheduled for pt this morning at 10.     Orbie Pyo Rice.Ed [employer] [phone]

## 2018-01-02 NOTE — Progress Notes (Signed)
Modified Barium Swallow Progress Note  Patient Details  Name: Albert Leonard MRN: [id number] Date of Birth: [birth year]  Today's Date: 01/02/2018  Modified Barium Swallow completed.  Full report located under Chart Review in the Imaging Section.  Brief recommendations include the following:  Clinical Impression  Pt exhibited mild dyspnea at baseline and pharyngeal phase of swallow was adequate during study this morning. He exhibted a timely onset of swallow with protective mechanisms intact preventing dysfunctional pharyngeal phase. One insignificant flash penetration episode during consecutive sips thin. Pharyngeal contraction and epiglottic inversion adequate and no pharyngeal residue present. Mildly impaired oral phase marked by difficulty propelling bolus requiring pt to perform neck/head extension to assist in transit. Post swallow lingual residue spilling to valleculla and pyriform sinsues which cleared with subsequent thin liquid. Mastication, control and transit with solid was fair-good. Educated pt and wife to results and risks he will experience as result of ALS including increased dyspnea, fatigue, likely decreased motor strength of oral and pharyngeal musculature and strategies to mitigate risks. Pt and wife voiced understanding. Recommending regular/thin liquids, pills with thin. He may benefit in the future from home health ST if/when needed arises.    Swallow Evaluation Recommendations       SLP Diet Recommendations: Regular solids;Thin liquid   Liquid Administration via: Cup;Straw   Medication Administration: Whole meds with liquid   Supervision: Patient able to self feed   Compensations: Slow rate;Small sips/bites   Postural Changes: Seated upright at 90 degrees   Oral Care Recommendations: Oral care BID        Houston Siren 01/02/2018,12:07 PM  Orbie Pyo Colvin Caroli.Ed [employer] [phone]

## 2018-01-02 NOTE — Care Management Note (Addendum)
Case Management Note  Patient Details  Name: Aldon Hengst MRN: [id number] Date of Birth: May 18, 1945  Subjective/Objective:   Patient for discharge today, wife states he has [facility] , [facility], [facility], [facility],  Patient has ALS and he is under a special program with the [place], they also have palliative, wife states they are still active with them.  NCM informed her that has been calling the [place] all morning and has not received a return call yet, wife states they are getting ready to go and she will have to call me when they get home.   [place], BSN - NCM received call from Court Joy at the [place] in [place], she states that the patient has RN, OT , and NP and they will do a consult for th PT.  Patient has been taken care of and there is nothing that this NCM needs to do.  [facility] rep will also call the wife to inform.                   Action/Plan: DC home .  Expected Discharge Date:  01/02/18               Expected Discharge Plan:  [place]  In-House Referral:     Discharge planning Services  CM Consult  Post Acute Care Choice:  Resumption of Svcs/PTA Provider Choice offered to:     DME Arranged:    DME Agency:     HH Arranged:    [place] Agency:     Status of Service:  Completed, signed off  If discussed at [employer] of Stay Meetings, dates discussed:    Additional Comments:  Zenon Mayo, RN 01/02/2018, 2:04 PM

## 2018-01-02 NOTE — Care Management Important Message (Signed)
Important Message  Patient Details  Name: Albert Leonard MRN: [id number] Date of Birth: Dec 08, 1945   Medicare Important Message Given:  Yes    Orbie Pyo 01/02/2018, 2:45 PM

## 2018-01-02 NOTE — Consult Note (Signed)
            [facility] CM Primary Care Navigator  01/02/2018  Albert Leonard Aug 23, 1945 [id number]   Went to seepatient at the bedside to identify possible discharge needs buthe wasalreadydischarged home per staff report. Per Inpatient CM note, patient has [place] RN, NP, OT, PT under [facility] for ALS (amyotrophic lateral sclerosis) and is also active with palliative care of [facility].  Per MD note,patientwas admitted for progressive shortness of breath (left lower lobe pneumonia treated with antibiotics)  Primary care provider's office is listed as providing transition of care (TOC) follow-up.   Patient has discharge instruction to follow-up withprimary care provider in 1 week.    For additional questions please contact:  Edwena Felty A. Shaylene Paganelli, BSN, RN-BC [facility] PRIMARY CARE Navigator Cell: [phone]

## 2018-01-02 NOTE — Evaluation (Signed)
Physical Therapy Evaluation Patient Details Name: Albert Leonard MRN: [id number] DOB: 07-24-1945 Today's Date: 01/02/2018   History of Present Illness  72 y.o. male with a Past Medical History of ALS with dysphagia who presents with acute on chronic respiratory failure.  He uses a trilogy ventilator machine at home and was seen earlier in the week at urgent care for diagnosed sinusitis. CT appears to show LLL PNA  Clinical Impression  PTA pt was ambulating independently and did not require assist for ADLs. Pt currently limited in safe mobility by decreased safety awareness with movement trying to convince PT that ALS is not effecting his movement. However pt does require RW for power up and for ambulation in hallway. PT educated on need to use rollator at home until he has regained his PLOF after hospitalization. PT recommends HHPT at d/c to work with pt on strength and balance.     Follow Up Recommendations Home health PT;Supervision - Intermittent    Equipment Recommendations  None recommended by PT    Recommendations for Other Services       Precautions / Restrictions Precautions Precautions: Fall Precaution Comments: (climbing stairs in 4/19) Restrictions Weight Bearing Restrictions: No      Mobility  Bed Mobility Overal bed mobility: Modified Independent             General bed mobility comments: use of bed rails to come to EoB  Transfers Overall transfer level: Needs assistance Equipment used: Rolling walker (2 wheeled) Transfers: Sit to/from Stand Sit to Stand: Min guard         General transfer comment: min guard for safety, able to powerup from low bed position   Ambulation/Gait Ambulation/Gait assistance: Min guard Gait Distance (Feet): 175 Feet Assistive device: Rolling walker (2 wheeled) Gait Pattern/deviations: Step-through pattern;WFL(Within Functional Limits) Gait velocity: too fast for conditions Gait velocity interpretation: >2.62 ft/sec,  indicative of community ambulatory General Gait Details: hands on min guard for safety, vc for slowing down        Balance Overall balance assessment: Needs assistance Sitting-balance support: Feet supported Sitting balance-Leahy Scale: Normal     Standing balance support: No upper extremity supported;During functional activity Standing balance-Leahy Scale: Good                               Pertinent Vitals/Pain Pain Assessment: No/denies pain    Home Living Family/patient expects to be discharged to:: Private residence Living Arrangements: Spouse/significant other Available Help at Discharge: Family;Available 24 hours/day Type of Home: House Home Access: Ramped entrance     Home Layout: Two level;Able to live on main level with bedroom/bathroom Home Equipment: Grab bars - toilet;Grab bars - tub/shower;Walker - 4 wheels;Shower seat - built in;Hand held shower head      Prior Function Level of Independence: Independent         Comments: independent limited community ambulation         Extremity/Trunk Assessment   Upper Extremity Assessment Upper Extremity Assessment: Generalized weakness    Lower Extremity Assessment Lower Extremity Assessment: Overall WFL for tasks assessed       Communication   Communication: Expressive difficulties(slight dysphagia)  Cognition Arousal/Alertness: Awake/alert Behavior During Therapy: WFL for tasks assessed/performed Overall Cognitive Status: Within Functional Limits for tasks assessed  General Comments: decreased safety awareness and awareness of deficits as he tries to prove that ALS is not affecting him      General Comments General comments (skin integrity, edema, etc.): Pt with 2L O2 via [place] maintaining SaO2 greater than 95%O2 throughout ambulation, despite good oxygenation pt with 3/4 DoE         Assessment/Plan    PT Assessment Patient needs continued  PT services  PT Problem List Decreased activity tolerance;Decreased safety awareness;Decreased knowledge of precautions;Decreased balance       PT Treatment Interventions DME instruction;Gait training;Functional mobility training;Therapeutic activities;Therapeutic exercise;Balance training;Patient/family education;Cognitive remediation    PT Goals (Current goals can be found in the Care Plan section)  Acute Rehab PT Goals Patient Stated Goal: go home today PT Goal Formulation: With patient/family Time For Goal Achievement: 01/16/18 Potential to Achieve Goals: Good    Frequency Min 3X/week    AM-PAC PT "6 Clicks" Daily Activity  Outcome Measure Difficulty turning over in bed (including adjusting bedclothes, sheets and blankets)?: None Difficulty moving from lying on back to sitting on the side of the bed? : None Difficulty sitting down on and standing up from a chair with arms (e.g., wheelchair, bedside commode, etc,.)?: Unable Help needed moving to and from a bed to chair (including a wheelchair)?: None Help needed walking in hospital room?: None Help needed climbing 3-5 steps with a railing? : A Little 6 Click Score: 20    End of Session Equipment Utilized During Treatment: Gait belt;Oxygen Activity Tolerance: Patient tolerated treatment well Patient left: in bed;with call bell/phone within reach;with bed alarm set;with family/visitor present Nurse Communication: Mobility status PT Visit Diagnosis: Other abnormalities of gait and mobility (R26.89);History of falling (Z91.81);Muscle weakness (generalized) (M62.81);Difficulty in walking, not elsewhere classified (R26.2)    Time: 1638-4665 PT Time Calculation (min) (ACUTE ONLY): 34 min   Charges:   PT Evaluation $PT Eval Moderate Complexity: 1 Mod PT Treatments $Gait Training: 8-22 mins        Kyndahl Jablon B. Migdalia Dk PT, DPT Acute Rehabilitation  [phone] Pager [phone]    [place] 01/02/2018, 2:29 PM

## 2018-01-02 NOTE — Progress Notes (Signed)
NIF -40 

## 2018-01-02 NOTE — Discharge Summary (Signed)
Discharge Summary  Albert Leonard WUJ:[id number] DOB: 03/07/1946  PCP: Seward Carol, MD  Admit date: 12/31/2017 Discharge date: 01/02/2018  Time spent: 86mins, more than 50% time spent on coordination of care.  Recommendations for Outpatient Follow-up:  1. F/u with PMD within a week  for hospital discharge follow up, repeat cbc/bmp at follow up 2. F/u with ALS specialist 3. Continue home health arranged by [facility] 4. Continue follow with hospice   Discharge Diagnoses:  Active Hospital Problems   Diagnosis Date Noted  . Pneumonia 12/31/2017    Resolved Hospital Problems  No resolved problems to display.    Discharge Condition: stable  Diet recommendation:   SLP Diet Recommendations: Regular solids;Thin liquid   Liquid Administration via: Cup;Straw   Medication Administration: Whole meds with liquid   Supervision: Patient able to self feed   Compensations: Slow rate;Small sips/bites   Postural Changes: Seated upright at 90 degrees   Oral Care Recommendations: Oral care BID  Filed Weights   12/31/17 1718  Weight: 73.9 kg    History of present illness: (per admitting provider PA Ms Threasa Alpha) PCP: Seward Carol, MD Patient coming from: home  Chief Complaint: Shortness of breath  HPI: Fed Ceci is a 72 y.o. 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  Pt's wife reports pt was diagnosed with a URI a week ago.  He has been on amoxicillin.  Pt has had increasing shortness of breath.  Pt has weakness in his upper arms from his ALS.  He is still able to ambulate with some weakness.      ED Course: Pt presented to the ED with shortness of breath and cough.  Pt has been on amoxicillin for URI.  Pt had a ct angio of his chest which showed volume loss in lower left lobe and opacification of much of bronchial tree serving left lowr lobe.  Pt was placed on bipap in ED and started on IV antibiotics.  Pulmonary Critical care was consulted  and have advised continuing bipap. Bronchodilators and Iv antibiotics.   Pt does not want intubation or chest compressions.     Hospital Course:  Active Problems:   Pneumonia   Progressive respiratory failure/distress -patient has underline ALS, he is  Recently started on home trilogy vent due to progressive sob (patient only use it during day time, he can not tolerated it at night -he was  Recently diagnosed with sinus infection, he presented to the ED on 8/21 due to increased sob with dry cough/chest congestion -he has delayed his peg placement due to recent infection -CTA on presentation negative for PE "Volume loss in the left lower lobe with some patchy parenchymal density. Opacification of much of the bronchial tree serving the left lower lobe. The findings could be due to bronchitis/pneumonia with associated atelectasis. I cannot rule out an endobronchial lesion at this time. Follow-up recommended." -Repeat cxr "Continued left base atelectasis or infiltrate" -he is started on unasyn due to concerning for aspiration pneumonia, he required intermittent bipap through out the day while in the hospital -pulmonology/critical care consulted, input appreciated -he is improving, less accessory muscle use, NIF improving from -20 to -40 --he underwent modified swallow eval, he did well, he is on regular diet and full liquid for now, he need to be on aspiration precaution, as progressive weakness and difficulty breathing is expected in the setting of ALS -he is discharged home on 5 more days of augmentin. [facility]      ALS Diagnosed in 2015 He has been on riluzole since  Wive reports patient has established care with [facility], they want to preserve quality of life and keep going as long as possible, wife declined need of palliative care consult here  Possible endobronchial lesion  Outpatient follow up   Code Status: DNR  Family Communication:  patient and wife  Disposition Plan: they wants to go home as early as possible Wife reports home health has been set up by [place]   Consultants:  Pulmonology/Critical care  speech  Procedures:  bipap  -modified barium swallow study on 8/23  Antibiotics:  Unasyn,   zithro  Discharge Exam: BP 109/72 (BP Location: Left Arm)   Pulse 69   Temp 98.9 F (37.2 C) (Oral)   Resp 20   Ht 6\' 2"  (1.88 m)   Wt 73.9 kg   SpO2 100%   BMI 20.92 kg/m   General: more comfortable, less accessory muscle use , no cough Cardiovascular: RRR Respiratory: CTABL  Discharge Instructions You were cared for by a hospitalist during your hospital stay. If you have any questions about your discharge medications or the care you received while you were in the hospital after you are discharged, you can call the unit and asked to speak with the hospitalist on call if the hospitalist that took care of you is not available. Once you are discharged, your primary care physician will handle any further medical issues. Please note that NO REFILLS for any discharge medications will be authorized once you are discharged, as it is imperative that you return to your primary care physician (or establish a relationship with a primary care physician if you do not have one) for your aftercare needs so that they can reassess your need for medications and monitor your lab values.   Allergies as of 01/02/2018   No Known Allergies     Medication List    TAKE these medications   amoxicillin-clavulanate 875-125 MG tablet Commonly known as:  AUGMENTIN Take 1 tablet by mouth 2 (two) times daily for 5 days.   folic acid 893 MCG tablet Commonly known as:  FOLVITE Take 400 mcg by mouth daily at 12 noon.   riluzole 50 MG tablet Commonly known as:  RILUTEK Take 50 mg by mouth every 12 (twelve) hours.   saw palmetto 160 MG capsule Take 160 mg by mouth daily.   TURMERIC PO Take 1 tablet by mouth daily.      No  Known Allergies    The results of significant diagnostics from this hospitalization (including imaging, microbiology, ancillary and laboratory) are listed below for reference.    Significant Diagnostic Studies: Ct Angio Chest Pe W/cm &/or Wo Cm  Result Date: 12/31/2017 CLINICAL DATA:  Worsening shortness of breath. EXAM: CT ANGIOGRAPHY CHEST WITH CONTRAST TECHNIQUE: Multidetector CT imaging of the chest was performed using the standard protocol during bolus administration of intravenous contrast. Multiplanar CT image reconstructions and MIPs were obtained to evaluate the vascular anatomy. CONTRAST:  76mL ISOVUE-370 IOPAMIDOL (ISOVUE-370) INJECTION 76% COMPARISON:  Radiography same day. FINDINGS: Cardiovascular: Pulmonary arterial opacification is excellent. There are no pulmonary emboli. There is aortic atherosclerosis but no sign of aneurysm or dissection. Heart size is normal. No pericardial fluid. Mediastinum/Nodes: There are enlarged nodes in the left hilum. No central mediastinal were paratracheal nodes. Lungs/Pleura: No pleural fluid on the right. Minimal atelectasis at the posterior inferior right lower lobe. Right lung otherwise clear. On the  left, there are abnormal findings in the left lower lobe. The left lower lobe bronchus is occluded as is much of the more distal left lower lobe bronchial tree. This could be due to a central obstructing lesion or to lower lobe pneumonia. There is fairly mild volume loss and patchy density within the left lower lobe lung parenchyma. No pleural fluid on the left. Upper Abdomen: Negative Musculoskeletal: Thoracic curvature convex to the right. No acute bone finding. Review of the MIP images confirms the above findings. IMPRESSION: Volume loss in the left lower lobe with some patchy parenchymal density. Opacification of much of the bronchial tree serving the left lower lobe. The findings could be due to bronchitis/pneumonia with associated atelectasis. I cannot  rule out an endobronchial lesion at this time. Follow-up recommended. No pulmonary emboli. Aortic Atherosclerosis (ICD10-I70.0). Electronically Signed   By: Nelson Chimes M.D.   On: 12/31/2017 10:14   Portable Chest 1 View  Result Date: 01/01/2018 CLINICAL DATA:  Shortness of breath EXAM: PORTABLE CHEST 1 VIEW COMPARISON:  12/31/2017 FINDINGS: Mild left base atelectasis or infiltrate, similar prior study. Right lung clear. Heart is normal size. No effusions or acute bony abnormality. IMPRESSION: Continued left base atelectasis or infiltrate. Electronically Signed   By: Rolm Baptise M.D.   On: 01/01/2018 08:08   Dg Chest Port 1 View  Result Date: 12/31/2017 CLINICAL DATA:  Shortness of breath with cough/congestion since yesterday. EXAM: PORTABLE CHEST 1 VIEW COMPARISON:  10/03/2017 FINDINGS: Lungs are adequately inflated without consolidation or effusion. Cardiomediastinal silhouette and remainder the exam is unchanged. IMPRESSION: No active disease. Electronically Signed   By: Marin Olp M.D.   On: 12/31/2017 07:11    Microbiology: Recent Results (from the past 240 hour(s))  MRSA PCR Screening     Status: None   Collection Time: 12/31/17  5:10 PM  Result Value Ref Range Status   MRSA by PCR NEGATIVE NEGATIVE Final    Comment:        The GeneXpert MRSA Assay (FDA approved for NASAL specimens only), is one component of a comprehensive MRSA colonization surveillance program. It is not intended to diagnose MRSA infection nor to guide or monitor treatment for MRSA infections. Performed at [facility], [place] [address] [place], [place] [address]      Labs: Basic Metabolic Panel: Recent Labs  Lab 12/31/17 0701 01/01/18 0303  NA 140 138  K 3.8 3.8  CL 101 103  CO2 29 28  GLUCOSE 105* 107*  BUN 10 8  CREATININE 0.74 0.65  CALCIUM 8.8* 8.5*   Liver Function Tests: Recent Labs  Lab 12/31/17 0701  AST 25  ALT 21  ALKPHOS 43  BILITOT 0.7  PROT 6.8  ALBUMIN 3.4*    No results for input(s): LIPASE, AMYLASE in the last 168 hours. No results for input(s): AMMONIA in the last 168 hours. CBC: Recent Labs  Lab 12/31/17 0701 01/01/18 0303  WBC 5.5 8.9  NEUTROABS 2.9  --   HGB 12.5* 11.6*  HCT 39.3 35.4*  MCV 91.4 90.1  PLT 142* 140*   Cardiac Enzymes: No results for input(s): CKTOTAL, CKMB, CKMBINDEX, TROPONINI in the last 168 hours. BNP: BNP (last 3 results) No results for input(s): BNP in the last 8760 hours.  ProBNP (last 3 results) No results for input(s): PROBNP in the last 8760 hours.  CBG: No results for input(s): GLUCAP in the last 168 hours.     Signed:  Florencia Reasons MD, PhD  [facility] 01/02/2018, 10:47 AM

## 2018-01-04 ENCOUNTER — Emergency Department (HOSPITAL_COMMUNITY): Payer: Medicare Other

## 2018-01-04 ENCOUNTER — Encounter (HOSPITAL_COMMUNITY): Payer: Self-pay | Admitting: Emergency Medicine

## 2018-01-04 ENCOUNTER — Other Ambulatory Visit: Payer: Self-pay

## 2018-01-04 ENCOUNTER — Observation Stay (HOSPITAL_COMMUNITY)
Admission: EM | Admit: 2018-01-04 | Discharge: 2018-01-05 | Disposition: A | Discharge status: Home / Self Care | Payer: Medicare Other | Attending: Internal Medicine | Admitting: Internal Medicine

## 2018-01-04 DIAGNOSIS — Z79899 Other long term (current) drug therapy: Secondary | ICD-10-CM | POA: Diagnosis not present

## 2018-01-04 DIAGNOSIS — R0602 Shortness of breath: Secondary | ICD-10-CM | POA: Diagnosis not present

## 2018-01-04 DIAGNOSIS — J9621 Acute and chronic respiratory failure with hypoxia: Secondary | ICD-10-CM | POA: Diagnosis not present

## 2018-01-04 DIAGNOSIS — R0603 Acute respiratory distress: Secondary | ICD-10-CM | POA: Diagnosis not present

## 2018-01-04 DIAGNOSIS — G1221 Amyotrophic lateral sclerosis: Secondary | ICD-10-CM | POA: Diagnosis not present

## 2018-01-04 DIAGNOSIS — Z66 Do not resuscitate: Secondary | ICD-10-CM | POA: Insufficient documentation

## 2018-01-04 LAB — I-STAT ARTERIAL BLOOD GAS, ED
ACID-BASE EXCESS: 3 mmol/L — AB (ref 0.0–2.0)
Bicarbonate: 24.7 mmol/L (ref 20.0–28.0)
O2 SAT: 97 %
PO2 ART: 71 mmHg — AB (ref 83.0–108.0)
Patient temperature: 98.1
TCO2: 26 mmol/L (ref 22–32)
pCO2 arterial: 26.5 mmHg — ABNORMAL LOW (ref 32.0–48.0)
pH, Arterial: 7.577 — ABNORMAL HIGH (ref 7.350–7.450)

## 2018-01-04 LAB — CBC WITH DIFFERENTIAL/PLATELET
Abs Immature Granulocytes: 0.1 10*3/uL (ref 0.0–0.1)
BASOS ABS: 0 10*3/uL (ref 0.0–0.1)
BASOS PCT: 0 %
EOS ABS: 0.4 10*3/uL (ref 0.0–0.7)
EOS PCT: 4 %
HCT: 40.6 % (ref 39.0–52.0)
HEMOGLOBIN: 13 g/dL (ref 13.0–17.0)
Immature Granulocytes: 1 %
LYMPHS PCT: 27 %
Lymphs Abs: 2.6 10*3/uL (ref 0.7–4.0)
MCH: 29.3 pg (ref 26.0–34.0)
MCHC: 32 g/dL (ref 30.0–36.0)
MCV: 91.4 fL (ref 78.0–100.0)
MONO ABS: 1.2 10*3/uL — AB (ref 0.1–1.0)
Monocytes Relative: 12 %
Neutro Abs: 5.3 10*3/uL (ref 1.7–7.7)
Neutrophils Relative %: 56 %
PLATELETS: 177 10*3/uL (ref 150–400)
RBC: 4.44 MIL/uL (ref 4.22–5.81)
RDW: 12.1 % (ref 11.5–15.5)
WBC: 9.5 10*3/uL (ref 4.0–10.5)

## 2018-01-04 LAB — PHOSPHORUS: Phosphorus: 2.8 mg/dL (ref 2.5–4.6)

## 2018-01-04 LAB — MAGNESIUM: Magnesium: 2 mg/dL (ref 1.7–2.4)

## 2018-01-04 LAB — LEGIONELLA PNEUMOPHILA SEROGP 1 UR AG: L. PNEUMOPHILA SEROGP 1 UR AG: NEGATIVE

## 2018-01-04 LAB — COMPREHENSIVE METABOLIC PANEL
ALBUMIN: 3.6 g/dL (ref 3.5–5.0)
ALK PHOS: 50 U/L (ref 38–126)
ALT: 26 U/L (ref 0–44)
ANION GAP: 9 (ref 5–15)
AST: 32 U/L (ref 15–41)
BUN: 9 mg/dL (ref 8–23)
CALCIUM: 8.9 mg/dL (ref 8.9–10.3)
CHLORIDE: 101 mmol/L (ref 98–111)
CO2: 29 mmol/L (ref 22–32)
Creatinine, Ser: 0.78 mg/dL (ref 0.61–1.24)
GFR calc non Af Amer: >60 mL/min (ref >60)
GLUCOSE: 129 mg/dL — AB (ref 70–99)
POTASSIUM: 3.7 mmol/L (ref 3.5–5.1)
SODIUM: 139 mmol/L (ref 135–145)
Total Bilirubin: 0.9 mg/dL (ref 0.3–1.2)
Total Protein: 7.1 g/dL (ref 6.5–8.1)

## 2018-01-04 LAB — I-STAT TROPONIN, ED: TROPONIN I, POC: 0.02 ng/mL (ref 0.00–0.08)

## 2018-01-04 MED ORDER — LACTATED RINGERS IV SOLN
INTRAVENOUS | Status: DC
  Administered 2018-01-04: 23:00:00 via INTRAVENOUS

## 2018-01-04 MED ORDER — ENOXAPARIN SODIUM 40 MG/0.4ML ~~LOC~~ SOLN
40.0000 mg | SUBCUTANEOUS | Status: DC
  Administered 2018-01-04: 40 mg via SUBCUTANEOUS
  Filled 2018-01-04: qty 0.4

## 2018-01-04 MED ORDER — AMOXICILLIN-POT CLAVULANATE 875-125 MG PO TABS
1.0000 | ORAL_TABLET | Freq: Two times a day (BID) | ORAL | Status: DC
  Administered 2018-01-05: 1 via ORAL
  Filled 2018-01-04: qty 1

## 2018-01-04 MED ORDER — RILUZOLE 50 MG PO TABS
50.0000 mg | ORAL_TABLET | Freq: Two times a day (BID) | ORAL | Status: DC
  Administered 2018-01-05: 50 mg via ORAL
  Filled 2018-01-04 (×4): qty 1

## 2018-01-04 MED ORDER — SODIUM CHLORIDE 0.9% FLUSH
3.0000 mL | Freq: Two times a day (BID) | INTRAVENOUS | Status: DC
  Administered 2018-01-04: 3 mL via INTRAVENOUS

## 2018-01-04 MED ORDER — LACTATED RINGERS IV BOLUS
500.0000 mL | Freq: Once | INTRAVENOUS | Status: AC
  Administered 2018-01-04: 500 mL via INTRAVENOUS

## 2018-01-04 MED ORDER — FOLIC ACID 1 MG PO TABS
1000.0000 ug | ORAL_TABLET | Freq: Every day | ORAL | Status: DC
  Administered 2018-01-05: 1 mg via ORAL
  Filled 2018-01-04: qty 1

## 2018-01-04 NOTE — ED Provider Notes (Signed)
[place] EMERGENCY DEPARTMENT Provider Note   CSN: [id number] Arrival date & time: 01/04/18  1723     History   Chief Complaint Chief Complaint  Patient presents with  . Respiratory Distress    HPI Albert Leonard is a 72 y.o. male.  HPI 72 year old male with history of ALS and recent admission 8/21-8/23 for pneumonia who presents to the emergency department today for evaluation of shortness of breath and increased work of breathing.  He is accompanied by his wife who reports that he has had worsening shortness of breath throughout today.  Has required his trilogy breathing machine much more today. Has been requiring more accessory musculature in order to catch his breath.  Denies any chest pain. No leg swelling. CTA on 8/22 with no PE though did have LLL opacity concerning for bronchitis/PNA.  Has been compliant with home Augmentin which he was discharged on twice daily x5 days. No fevers.  On prior admission patient reported that he would like to be DNR/DNI.  Would not want tracheostomy.  He has reexpressed these wishes again today and his wife confirms these wishes as well.  Past Medical History:  Diagnosis Date  . 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 Medical history non-contributory     Patient Active Problem List   Diagnosis Date Noted  . Respiratory distress 01/04/2018  . Amyotrophic lateral sclerosis (ALS) ([place])   . Pneumonia 12/31/2017    Past Surgical History:  Procedure Laterality Date  . COLONOSCOPY WITH PROPOFOL N/A 02/16/2013   Procedure: COLONOSCOPY WITH PROPOFOL;  Surgeon: Garlan Fair, MD;  Location: [facility] END[facility]COPY;  Service: Endoscopy;  Laterality: N/A;  . colonscopy    . UPPER GI ENDOSCOPY  2013        Home Medications    Prior to Admission medications   Medication Sig Start Date End Date Taking? Authorizing Provider  amoxicillin-clavulanate (AUGMENTIN) 875-125 MG tablet Take 1 tablet by mouth 2 (two) times daily for 5 days. 01/02/18  01/07/18 Yes Florencia Reasons, MD  riluzole (RILUTEK) 50 MG tablet Take 50 mg by mouth every 12 (twelve) hours.   Yes [provider]    Family History No family history on file.  Social History Social History   Tobacco Use  . Smoking status: Never Smoker  . Smokeless tobacco: Never Used  Substance Use Topics  . Alcohol use: No  . Drug use: No     Allergies   Patient has no known allergies.   Review of Systems Review of Systems  Constitutional: Negative for chills and fever.  HENT: Negative for congestion.   Respiratory: Positive for shortness of breath. Negative for cough.   Cardiovascular: Negative for chest pain and leg swelling.  Gastrointestinal: Negative for abdominal pain, diarrhea, nausea and vomiting.  Genitourinary: Negative for dysuria.  Skin: Negative for rash.  Neurological: Negative for weakness, numbness and headaches.  All other systems reviewed and are negative.    Physical Exam Updated Vital Signs BP 117/79   Pulse 83   Temp 98.1 F (36.7 C) (Temporal)   Resp (!) 28   SpO2 97%   Physical Exam  Constitutional: He appears distressed.  HENT:  Head: Normocephalic and atraumatic.  Eyes: Conjunctivae are normal. Right eye exhibits no discharge. Left eye exhibits no discharge.  Neck: Normal range of motion. No tracheal deviation present.  Cardiovascular: Regular rhythm, normal heart sounds and intact distal pulses.  Pulmonary/Chest: No stridor. He is in respiratory distress. He has no wheezes. He  has no rales.  CTAB. Increased accessory muscle use including neck with head-bobbing and intercostals.  Abdominal: Soft. Bowel sounds are normal. He exhibits no distension. There is no tenderness.  Musculoskeletal: He exhibits no edema or deformity.  Neurological: He is alert. He exhibits normal muscle tone.  Skin: Capillary refill takes less than 2 seconds. No rash noted. He is not diaphoretic.  Psychiatric: He has a normal mood and affect.  Nursing  note and vitals reviewed.    ED Treatments / Results  Labs (all labs ordered are listed, but only abnormal results are displayed) Labs Reviewed  CBC WITH DIFFERENTIAL/PLATELET - Abnormal; Notable for the following components:      Result Value   Monocytes Absolute 1.2 (*)    All other components within normal limits  COMPREHENSIVE METABOLIC PANEL - Abnormal; Notable for the following components:   Glucose, Bld 129 (*)    All other components within normal limits  I-STAT ARTERIAL BLOOD GAS, ED - Abnormal; Notable for the following components:   pH, Arterial 7.577 (*)    pCO2 arterial 26.5 (*)    pO2, Arterial 71.0 (*)    Acid-Base Excess 3.0 (*)    All other components within normal limits  MAGNESIUM  PHOSPHORUS  BLOOD GAS, ARTERIAL  BASIC METABOLIC PANEL  CBC  I-STAT TROPONIN, ED    EKG EKG Interpretation  Date/Time:  Sunday January 04 2018 17:33:03 EDT Ventricular Rate:  95 PR Interval:    QRS Duration: 107 QT Interval:  350 QTC Calculation: 440 R Axis:   22 Text Interpretation:  Sinus rhythm Abnormal R-wave progression, early transition Artifact in lead(s) I II III aVR aVL aVF V1 V2 V3 V4 V5 V6 No significant change since last tracing Confirmed by Blanchie Dessert [phone]) on 01/04/2018 6:51:19 PM   Radiology Dg Chest Portable 1 View  Result Date: 01/04/2018 CLINICAL DATA:  Shortness of breath, on BiPAP EXAM: PORTABLE CHEST 1 VIEW COMPARISON:  12/30/2017 FINDINGS: Mild eventration of the left hemidiaphragm. Right lung is essentially clear. No pleural effusion or pneumothorax. The heart is normal in size. IMPRESSION: No evidence of acute cardiopulmonary disease. Electronically Signed   By: Julian Hy M.D.   On: 01/04/2018 17:47    Procedures Procedures (including critical care time)  Medications Ordered in ED Medications  amoxicillin-clavulanate (AUGMENTIN) 875-125 MG per tablet 1 tablet (has no administration in time range)  folic acid (FOLVITE) tablet 400  mcg (has no administration in time range)  riluzole (RILUTEK) tablet 50 mg (has no administration in time range)  enoxaparin (LOVENOX) injection 40 mg (has no administration in time range)  sodium chloride flush (NS) 0.9 % injection 3 mL (has no administration in time range)  lactated ringers infusion (has no administration in time range)  lactated ringers bolus 500 mL (0 mLs Intravenous Stopped 01/04/18 1847)     Initial Impression / Assessment and Plan / ED Course  I have reviewed the triage vital signs and the nursing notes.  Pertinent labs & imaging results that were available during my care of the patient were reviewed by me and considered in my medical decision making (see chart for details).    72 year old male with history of ALS and recent admission 8/21-8/23 for pneumonia who presents to the emergency department today for evaluation of shortness of breath and increased work of breathing.  Patient afebrile at presentation.  Tachypneic with increased work of breathing as detailed above.  Answering only one-word sentences.  Stable blood pressure.  Stable on  room air.  Given significant increased work of breathing patient placed on BiPAP and reports improvement in symptoms.  He remains tachypneic.  Pulling erratic tidal volumes.  He is clear to auscultation bilaterally.  Chest x-ray obtained that shows no acute abnormalities.  No pneumothorax.  No effusions or consolidations.  He has had no fevers or significant cough to suggest pneumonia and is on Augmentin from recent pneumonia diagnosis.  Unlikely the patient has developed acute PE since negative PE study on 8/22.  His ECG was reviewed that shows poor baseline artifact given increased work of breathing.  Sinus rhythm.  No obvious ST elevations.  History exam not consistent with ACS at this time.  Has no chest pain.  No history of CAD.  Labs reviewed including CBC, CMP, troponin all of which are unremarkable.  We feel this is more likely  related to decline in patient's ALS at this time.  He prefers to remain DNR/DNI at this time.  Has multiple family members at bedside who are supportive of this decision.  States they have not spoken with palliative care in the past.  Have no home palliative measures established.  They would prefer admission at this time for close monitoring and management. Will admit to hospitalist team. Hospitalist team had critical care evaluate in ED, recs in chart.    Case and plan of care discussed with Dr. Maryan Rued.   Final Clinical Impressions(s) / ED Diagnoses   Final diagnoses:  Respiratory distress    ED Discharge Orders    None       Corrie Dandy, MD 01/04/18 0277    Blanchie Dessert, MD 01/05/18 [phone]

## 2018-01-04 NOTE — H&P (Signed)
History and Physical   Albert Leonard VOJ:[id number] DOB: 06-02-1945 DOA: 01/04/2018  PCP: Seward Carol, MD  Chief Complaint: shortness of breath  HPI: this is a 72 year old man with amyotrophic lateral sclerosis primarily affecting his chest and upper extremities presenting with shortness of breath.  History is obtained via chart review and discussion with his family including his spouse at the bedside.  History obtained from the patient is limited due to BiPAP in use. He was referred hospitalized from August 21 through 01/02/2018 with his treated with antimicrobials for pneumonia. He is discharged to complete an outpatient course of Augmentin.  They report he has started to use a trilogy ventilator device at home (non-invasive) and this started about 6-8 weeks ago. He's been using this primarily during the day, as it's difficult for him to sleep with this night.  Since being discharged from the hospital, he developed worsening shortness of breath on 01/04/2018 throughout the day. Associated symptoms include increasing weakness, conversational dyspnea which prompted his spouse to bring him to the emergency room. They report some chest congestion, deny fevers. He reports he has not had any dietary restrictions on account of his ALS. Primary ALS specialist at [facility].  At baseline, he is retired from [employer], walks without assistive devices, enjoys social interactions with his family. They report he was diagnosed with ALS and 2015. Although in the past they have expressed not willing to pursue intubation if his respiratory failure were to worsen, currently the patient and spouse agree that if his respiratory status were to worsen that he would want intubation with ventilatory support.  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 His prominent symptoms are his upper  extremity weakness and respiratory decline. In the past, he expressed that he would not want NIV or PEG, but he is now on BiPAP, and today says that he would consider PEG or tracheostomy if it would prolong his life while maintaining a reasonable quality of life. We will have him see PT for the new fall, nutrition for the weight loss, and have him undergo a cognitive evaluation today."  ED Course: in the emergency department vital signs were remarkable for respiratory rate ranging from 24-40. CBC and BMP unremarkable. Chest x-ray did not reveal acute process area and was initiated on BiPAP at FiO2 40%, 14/7 centimeters of H2O settings.  ABG revealed a pH of 7.6, PaCO2 of 26, PaO2 71. Patient is given 500 mL of lactated Ringer's. Hospital medicine was consulted for further management.  Review of Systems: A complete ROS was not obtained due to current respiratory distress, on BIPAP. Did report 5# wt loss over past 6 months.  Past Medical History:  Diagnosis Date  . 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 Medical history non-contributory    Social History   Socioeconomic History  . Marital status: Married    Spouse name: Not on file  . Number of children: Not on file  . Years of education: Not on file  . Highest education level: Not on file  Occupational History  . Not on file  Social Needs  . Financial resource strain: Not on file  . Food insecurity:    Worry: Not on file    Inability: Not on file  . Transportation needs:    Medical: Not on file    Non-medical: Not on file  Tobacco Use  . Smoking  status: Never Smoker  . Smokeless tobacco: Never Used  Substance and Sexual Activity  . Alcohol use: No  . Drug use: No  . Sexual activity: Not on file  Lifestyle  . Physical activity:    Days per week: Not on file    Minutes per session: Not on file  . Stress: Not on file  Relationships  . Social connections:    Talks on phone: Not on file    Gets together: Not on file    Attends religious  service: Not on file    Active member of club or organization: Not on file    Attends meetings of clubs or organizations: Not on file    Relationship status: Not on file  . Intimate partner violence:    Fear of current or ex partner: Not on file    Emotionally abused: Not on file    Physically abused: Not on file    Forced sexual activity: Not on file  Other Topics Concern  . Not on file  Social History Narrative  . Not on file   Family hx: unable to be obtained 2/2 to BIPAP status.  Physical Exam: Vitals:   01/04/18 2045 01/04/18 2200 01/04/18 2245 01/04/18 2246  BP: 104/70 117/79  128/72  Pulse: 72 83 89 86  Resp: (!) 27 (!) 28 (!) 34 (!) 30  Temp:    98.8 F (37.1 C)  TempSrc:    Axillary  SpO2: 100% 97% 98% 99%  Weight:    73.8 kg  Height:    6\' 2"  (1.88 m)   General:  Thin black man, mild distress due to increased work of breathing and BIPAP use ENT:  No epistaxis Cardiovascular: RRR. No M/R/G. No LE edema.  Respiratory: CTA bilaterally. On BIPAP, mild accessory muscle use, RR increased ~30 Abdomen: Soft, non-tender. Skin: No rash or induration seen on limited exam. Musculoskeletal: muscle wasting noted, more so in UEs  Psychiatric: Grossly normal mood and affect. Neurologic: Moves all extremities in coordinated fashion.  I have personally reviewed the following labs, culture data, and imaging studies.  Assessment/Plan:  #Respiratory (hypoxic) failure -acute on chronic, suspected related to ALS progression Course: recent admission for treatment of PNA, came in with increasing work of breathing, dyspnea with speech, and weakness; placed on BIPAP for respiratory distress  A/P: no clear superimposed causes easily identifiable for his progressive dyspnea (lower suspicion for significant PNA, HF, PE, etc); therefore, suspect progression of his ALS as primary driver.  Hopeful that NIPPV will be successful in optimizing oxygenation / ventilation. However, initial BIPAP  settings in ED - patient still with increased RR and increased work of breathing. Therefore, pulmonary / critical care team consulted to assist in optimizing NIPPV settings.  At this point in time the patient is answering questions appropriately and follows commands.  Will continue on NIPPV in step-down unit.  Continue home riluzole.  Will provide maintenance IVFs for now, continue to re-assess.  NIF ordered when respiratory is able.  #Other problems: -PNA, present prior to admission: to complete course of Augmentin, end date 01/06/2018  DVT prophylaxis: Subq Lovenox Code Status: Full code - discussed with spouse and patient who relay this Disposition Plan: to be determined based on clinical course Consults called: critical care / pulmonary; consider palliative consultation Admission status: admit to step down unit   Cheri Rous, MD [facility] Page:[phone]  If 7PM-7AM, please contact night-coverage [email or web address] Password [email or web address]

## 2018-01-04 NOTE — ED Notes (Signed)
Admitting MD at bedside.

## 2018-01-04 NOTE — Progress Notes (Addendum)
Transferred from [facility] to V-60 and transported patient from [facility] to 4E25 via BIPAP without complications.

## 2018-01-04 NOTE — Consult Note (Addendum)
Name: Diar Berkel MRN: [id number] DOB: 08-28-1945    ADMISSION DATE:  01/04/2018 CONSULTATION DATE:  01/04/2018  REFERRING MD :  Dr. Maryan Rued EDP  CHIEF COMPLAINT:  Dyspnea   HISTORY OF PRESENT ILLNESS:  72 year old male with PMH as below, which is significant for ALS complicated by significant functional decline requiring intermittent Trilogy NIMV at baseline. He has been recommended to wear this PRN during the day and during all sleeping hours, however, he does not use it while sleeping. He is followed for this at [facility]. He is known to [place] and has endorsed full DNR/DNI on previous admissions.   He was recently admitted to [facility] 8/21 for suspected CAP vs Aspiration PNA after developing a sinusitis several days prior. He presented profoundly dyspneic and was admitted for treatment with IV antibiotics. Of note, imaging at that time included Xray and CTA chest. No clear radiographic evidence of pneumonia was discovered. He had a swallow study on 8/23, which did demonstrate some minor issues, but a full solid diet with thin liquids was recommended. He was discharged 8/23 with a 5 day course of augmentin.   Since being discharged he has again become progressively more dyspneic requiring him to wear his Trilogy nearly full-time. For this reason he again presented to [facility] ED 8/25. Workup in the emergency department was essentially negative for acute findings, however, he remained profoundly tachypniec despite having BiPAP. PCCM was asked to see in consultation.    SIGNIFICANT EVENTS  8/21-23 admit for CAP vs Aspiration   STUDIES:  CXR 8/25 non-acute.    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  has a past surgical history that includes Upper gi endoscopy (2013); colonscopy; and Colonoscopy with propofol (N/A, 02/16/2013). Prior to Admission medications   Medication Sig Start Date End Date  Taking? Authorizing Provider  amoxicillin-clavulanate (AUGMENTIN) 875-125 MG tablet Take 1 tablet by mouth 2 (two) times daily for 5 days. 01/02/18 01/07/18  Florencia Reasons, MD  folic acid (FOLVITE) 253 MCG tablet Take 400 mcg by mouth daily at 12 noon.    [provider]  riluzole (RILUTEK) 50 MG tablet Take 50 mg by mouth every 12 (twelve) hours.    [provider]  saw palmetto 160 MG capsule Take 160 mg by mouth daily.     [provider]  TURMERIC PO Take 1 tablet by mouth daily.    [provider]   No Known Allergies  FAMILY HISTORY:  family history is not on file. SOCIAL HISTORY:  reports that he has never smoked. He has never used smokeless tobacco. He reports that he does not drink alcohol or use drugs.  REVIEW OF SYSTEMS:  Limited due to dyspnea and bipap Bolds are positive  Constitutional: weight loss, gain, night sweats, Fevers, chills, fatigue .  HEENT: headaches, Sore throat, sneezing, nasal congestion, post nasal drip, Difficulty swallowing, Tooth/dental problems, visual complaints visual changes, ear ache CV:  chest pain, radiates:,Orthopnea, PND, swelling in lower extremities, dizziness, palpitations, syncope.  GI  heartburn, indigestion, abdominal pain, nausea, vomiting, diarrhea, change in bowel habits, loss of appetite, bloody stools.  Resp: cough, productive: , hemoptysis, dyspnea, chest pain, pleuritic.  Skin: rash or itching or icterus GU: dysuria, change in color of urine, urgency or frequency. flank pain, hematuria  MS: joint pain or swelling. decreased range of motion  Psych: change in mood or affect. depression or  anxiety.  Neuro: difficulty with speech, weakness, numbness, ataxia   SUBJECTIVE:   VITAL SIGNS: Temp:  [98.1 F (36.7 C)] 98.1 F (36.7 C) (08/25 1731) Pulse Rate:  [66-97] 72 (08/25 2045) Resp:  [24-40] 27 (08/25 2045) BP: (98-120)/(66-91) 104/70 (08/25 2045) SpO2:  [91 %-100 %] 100 % (08/25 2045) FiO2 (%):  [40  %] 40 % (08/25 1922)  PHYSICAL EXAMINATION: General:  Frail elderly gentleman in mild resp distress on BiPAP Neuro:  Spontaneously awake, alert, oriented.  HEENT:  [place]/AT, PERRL, no JVD Cardiovascular:  RRR, no appreciable MRG. No peripheral edema.  Lungs:  Clear, bilateral breath sounds Abdomen:  Soft, non-tender, non-disntended Musculoskeletal:  No acute deformity, 3-4/5 ROM in all extremities.  Skin:  Grossly intact.   Recent Labs  Lab 12/31/17 0701 01/01/18 0303 01/04/18 1740  NA 140 138 139  K 3.8 3.8 3.7  CL 101 103 101  CO2 29 28 29   BUN 10 8 9   CREATININE 0.74 0.65 0.78  GLUCOSE 105* 107* 129*   Recent Labs  Lab 12/31/17 0701 01/01/18 0303 01/04/18 1740  HGB 12.5* 11.6* 13.0  HCT 39.3 35.4* 40.6  WBC 5.5 8.9 9.5  PLT 142* 140* 177   Dg Chest Portable 1 View  Result Date: 01/04/2018 CLINICAL DATA:  Shortness of breath, on BiPAP EXAM: PORTABLE CHEST 1 VIEW COMPARISON:  12/30/2017 FINDINGS: Mild eventration of the left hemidiaphragm. Right lung is essentially clear. No pleural effusion or pneumothorax. The heart is normal in size. IMPRESSION: No evidence of acute cardiopulmonary disease. Electronically Signed   By: Julian Hy M.D.   On: 01/04/2018 17:47    ASSESSMENT / PLAN:  Acute on chronic versus progression on chronic hypoxemic respiratory failure in the setting of ALS: On Trilogy vent frequently at home. Just discharged 2 days ago from admission with possible CAP although imaging not definite. PCT negative at that time and remains negative. No WBC elevation or fever on presentation today. Doubt his current presentation represents infection. Imaging remains negative as well. CTA negative for PE on 8/21. The most likely scenario here is unfortunately progression of his chronic illness.  Plan: - Finish planned course of Augmentin - Continue PRN BiPAP during waking hours, with mandatory nocturnal use if he will tolerate. Keep SpO2 90-95%.  - Ask respiration  therapy to assess ABG and NIF - Has been clear about DNR/DNI on his past admission and understands  - Pulmonary hygiene - Turned down palliative medicine consult on last admission, but would still likely be of value.   Georgann Housekeeper, AGACNP-BC [facility]/Critical Care Pager [phone] or [phone]  01/04/2018 9:40 PM   Staff Attending Note:  Personally reviewed this patient's medical history via him and his wife, as well as his medical record ans personally performed a physical examination. I agree with the discussion and description and physical examination and depicted in Mr. Hoffman's (ANP) note. I also concur with the assessment and plan as described.  Jamesetta So, M.D.

## 2018-01-04 NOTE — ED Triage Notes (Signed)
Pt to ED with c/o resp distress.  Pt was dx from hospital on Fri after being dx with pneumonia.  Pt has hx of ALS.

## 2018-01-05 DIAGNOSIS — G1221 Amyotrophic lateral sclerosis: Secondary | ICD-10-CM | POA: Diagnosis not present

## 2018-01-05 DIAGNOSIS — R0603 Acute respiratory distress: Secondary | ICD-10-CM | POA: Diagnosis not present

## 2018-01-05 DIAGNOSIS — J9621 Acute and chronic respiratory failure with hypoxia: Secondary | ICD-10-CM | POA: Diagnosis not present

## 2018-01-05 LAB — CBC
HEMATOCRIT: 34 % — AB (ref 39.0–52.0)
HEMOGLOBIN: 11.1 g/dL — AB (ref 13.0–17.0)
MCH: 29.3 pg (ref 26.0–34.0)
MCHC: 32.6 g/dL (ref 30.0–36.0)
MCV: 89.7 fL (ref 78.0–100.0)
Platelets: 168 10*3/uL (ref 150–400)
RBC: 3.79 MIL/uL — ABNORMAL LOW (ref 4.22–5.81)
RDW: 12.1 % (ref 11.5–15.5)
WBC: 7.6 10*3/uL (ref 4.0–10.5)

## 2018-01-05 LAB — BASIC METABOLIC PANEL
ANION GAP: 8 (ref 5–15)
BUN: 7 mg/dL — AB (ref 8–23)
CHLORIDE: 101 mmol/L (ref 98–111)
CO2: 29 mmol/L (ref 22–32)
Calcium: 8.4 mg/dL — ABNORMAL LOW (ref 8.9–10.3)
Creatinine, Ser: 0.71 mg/dL (ref 0.61–1.24)
GFR calc Af Amer: >60 mL/min (ref >60)
GFR calc non Af Amer: >60 mL/min (ref >60)
GLUCOSE: 97 mg/dL (ref 70–99)
POTASSIUM: 3.4 mmol/L — AB (ref 3.5–5.1)
Sodium: 138 mmol/L (ref 135–145)

## 2018-01-05 NOTE — Progress Notes (Signed)
Patient asked to come off of the BiPAP, patient tolerated well, SATS 95% on room air, RR 24 BPM, HR 74 BPM, NPB 128/78, patient also performed NIF (X3), with readings of -25 cmH2O.

## 2018-01-05 NOTE — Progress Notes (Signed)
PT received discharge instructions with wife at bedside. Home meds returned to pt from pharmacy. Iv removed and intact. All pt belongings with wife. Pt states breathing has returned to normal. No complaints. RR 35 shallow P 72r BP 122/64. Pt tx via wheelchair to ER entrance to meet wife. Jerald Kief, RN

## 2018-01-05 NOTE — Discharge Summary (Signed)
DISCHARGE SUMMARY  Albert Leonard  MR#: [id number]  DOB:November 30, 1945  Date of Admission: 01/04/2018 Date of Discharge: 01/05/2018  Attending Physician:Jeffrey Hennie Duos, MD  Patient's YTK:ZSWFUX, Albert Moll, MD  Consults:  PCCM  Disposition: D/C home   Follow-up Appts: Follow-up Information    Seward Carol, MD Follow up in 1 week(s).   Specialty:  Internal Medicine Contact information: 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 He was hospitalized August 21 through August 23 and treated with abx for pneumonia. He has a Trilogy vent at home.    At the time of his admission, despite prior notes, patient and spouse agreed that if his respiratory status were to worsen that he would want intubation/ventilatory support.  Hospital Course: The patient was admitted to the acute units in clear respiratory distress with hypoxia and tachypnea.  He was placed on continuous BiPAP.  He tolerated this well.  His respiratory status stabilized.  Critical care was consulted.  They felt this likely represented progression of the patient's ALS.  There was indeed no evidence of recurrent pneumonia on follow-up imaging, and the clinical picture did not fit that of a new acute infection.  On 01/05/2018 despite ongoing tachypnea the patient felt that he was back to his baseline respiratory status.  His wife agreed.  He was otherwise alert oriented and fully conversant.  He stated he would prefer to be at home and both he and his wife felt comfortable being discharged home.  In that the patient already has a Trilogy device at home and is well versed in its use, and given that no other therapies were being provided in the hospital apart from  BiPAP support, it was felt reasonable to discharge the patient home as per his request.  Allergies as of 01/05/2018   No Known Allergies     Medication List    TAKE these medications   amoxicillin-clavulanate 875-125 MG tablet Commonly known as:  AUGMENTIN Take 1 tablet by mouth 2 (two) times daily for 5 days.   riluzole 50 MG tablet Commonly known as:  RILUTEK Take 50 mg by mouth every 12 (twelve) hours.       Day of Discharge BP 123/83 (BP Location: Right Arm)   Pulse 73   Temp 98.1 F (36.7 C) (Oral)   Resp (!) 24   Ht 6\' 2"  (1.88 m)   Wt 73.8 kg Comment: Scale B  SpO2 94%   BMI 20.89 kg/m   Physical Exam: General: tachypneic but does not appear uncomfortable  Lungs: no wheezing or crackles on exam  Cardiovascular: Regular rate and rhythm without murmur gallop or rub normal S1 and S2 Abdomen: Nontender, nondistended, soft, bowel sounds positive, no rebound, no ascites, no appreciable mass Extremities: No significant cyanosis, clubbing, or edema bilateral lower extremities  Basic Metabolic Panel: Recent Labs  Lab 12/31/17 0701 01/01/18 0303 01/04/18 1740 01/05/18 0355  NA 140 138 139 138  K 3.8 3.8 3.7 3.4*  CL 101 103 101 101  CO2 29 28 29 29   GLUCOSE 105* 107* 129* 97  BUN 10 8 9  7*  CREATININE 0.74 0.65 0.78 0.71  CALCIUM 8.8* 8.5* 8.9 8.4*  MG  --   --  2.0  --  PHOS  --   --  2.8  --     Liver Function Tests: Recent Labs  Lab 12/31/17 0701 01/04/18 1740  AST 25 32  ALT 21 26  ALKPHOS 43 50  BILITOT 0.7 0.9  PROT 6.8 7.1  ALBUMIN 3.4* 3.6   CBC: Recent Labs  Lab 12/31/17 0701 01/01/18 0303 01/04/18 1740 01/05/18 0355  WBC 5.5 8.9 9.5 7.6  NEUTROABS 2.9  --  5.3  --   HGB 12.5* 11.6* 13.0 11.1*  HCT 39.3 35.4* 40.6 34.0*  MCV 91.4 90.1 91.4 89.7  PLT 142* 140* 177 168    Recent Results (from the past 240 hour(s))  MRSA PCR Screening     Status: None   Collection Time: 12/31/17  5:10 PM  Result Value Ref Range Status   MRSA  by PCR NEGATIVE NEGATIVE Final    Comment:        The GeneXpert MRSA Assay (FDA approved for NASAL specimens only), is one component of a comprehensive MRSA colonization surveillance program. It is not intended to diagnose MRSA infection nor to guide or monitor treatment for MRSA infections. Performed at [facility], [place] [address] [place], [place] [address]      Time spent in discharge (includes decision making & examination of pt): 30 minutes  01/05/2018, 4:21 PM   Cherene Altes, MD [facility] Office  [phone] Pager [phone]  On-Call/Text Page:      [email or web address]      password [facility]

## 2018-01-08 DIAGNOSIS — J9611 Chronic respiratory failure with hypoxia: Secondary | ICD-10-CM | POA: Diagnosis not present

## 2018-01-08 DIAGNOSIS — G1221 Amyotrophic lateral sclerosis: Secondary | ICD-10-CM | POA: Diagnosis not present

## 2018-01-28 DIAGNOSIS — Z8701 Personal history of pneumonia (recurrent): Secondary | ICD-10-CM | POA: Diagnosis not present

## 2018-01-28 DIAGNOSIS — Z79899 Other long term (current) drug therapy: Secondary | ICD-10-CM | POA: Diagnosis not present

## 2018-01-28 DIAGNOSIS — G1221 Amyotrophic lateral sclerosis: Secondary | ICD-10-CM | POA: Diagnosis not present

## 2018-01-28 DIAGNOSIS — Z9181 History of falling: Secondary | ICD-10-CM | POA: Diagnosis not present

## 2018-01-28 DIAGNOSIS — J189 Pneumonia, unspecified organism: Secondary | ICD-10-CM | POA: Diagnosis not present

## 2018-01-28 DIAGNOSIS — R0602 Shortness of breath: Secondary | ICD-10-CM | POA: Diagnosis not present

## 2018-01-30 DIAGNOSIS — Z23 Encounter for immunization: Secondary | ICD-10-CM | POA: Diagnosis not present

## 2018-03-04 DIAGNOSIS — R471 Dysarthria and anarthria: Secondary | ICD-10-CM | POA: Diagnosis not present

## 2018-03-04 DIAGNOSIS — Z8701 Personal history of pneumonia (recurrent): Secondary | ICD-10-CM | POA: Diagnosis not present

## 2018-03-04 DIAGNOSIS — R1310 Dysphagia, unspecified: Secondary | ICD-10-CM | POA: Diagnosis not present

## 2018-03-04 DIAGNOSIS — G4736 Sleep related hypoventilation in conditions classified elsewhere: Secondary | ICD-10-CM | POA: Diagnosis not present

## 2018-03-04 DIAGNOSIS — R64 Cachexia: Secondary | ICD-10-CM | POA: Diagnosis not present

## 2018-03-04 DIAGNOSIS — G1221 Amyotrophic lateral sclerosis: Secondary | ICD-10-CM | POA: Diagnosis not present

## 2018-03-04 DIAGNOSIS — Z9989 Dependence on other enabling machines and devices: Secondary | ICD-10-CM | POA: Diagnosis not present

## 2018-03-04 DIAGNOSIS — R634 Abnormal weight loss: Secondary | ICD-10-CM | POA: Diagnosis not present

## 2018-03-04 DIAGNOSIS — E0590 Thyrotoxicosis, unspecified without thyrotoxic crisis or storm: Secondary | ICD-10-CM | POA: Diagnosis not present

## 2018-03-06 DIAGNOSIS — G4736 Sleep related hypoventilation in conditions classified elsewhere: Secondary | ICD-10-CM | POA: Diagnosis not present

## 2018-03-06 DIAGNOSIS — R471 Dysarthria and anarthria: Secondary | ICD-10-CM | POA: Diagnosis not present

## 2018-03-06 DIAGNOSIS — R634 Abnormal weight loss: Secondary | ICD-10-CM | POA: Diagnosis not present

## 2018-03-06 DIAGNOSIS — R1310 Dysphagia, unspecified: Secondary | ICD-10-CM | POA: Diagnosis not present

## 2018-03-06 DIAGNOSIS — Z8701 Personal history of pneumonia (recurrent): Secondary | ICD-10-CM | POA: Diagnosis not present

## 2018-03-06 DIAGNOSIS — G1221 Amyotrophic lateral sclerosis: Secondary | ICD-10-CM | POA: Diagnosis not present

## 2018-03-12 DIAGNOSIS — Z66 Do not resuscitate: Secondary | ICD-10-CM | POA: Diagnosis not present

## 2018-03-12 DIAGNOSIS — R109 Unspecified abdominal pain: Secondary | ICD-10-CM | POA: Diagnosis not present

## 2018-03-12 DIAGNOSIS — G1221 Amyotrophic lateral sclerosis: Secondary | ICD-10-CM | POA: Diagnosis not present

## 2018-03-12 DIAGNOSIS — R471 Dysarthria and anarthria: Secondary | ICD-10-CM | POA: Diagnosis not present

## 2018-03-12 DIAGNOSIS — R1310 Dysphagia, unspecified: Secondary | ICD-10-CM | POA: Diagnosis not present

## 2018-03-12 DIAGNOSIS — Z8701 Personal history of pneumonia (recurrent): Secondary | ICD-10-CM | POA: Diagnosis not present

## 2018-03-12 DIAGNOSIS — G4736 Sleep related hypoventilation in conditions classified elsewhere: Secondary | ICD-10-CM | POA: Diagnosis not present

## 2018-03-12 DIAGNOSIS — R634 Abnormal weight loss: Secondary | ICD-10-CM | POA: Diagnosis not present

## 2018-03-13 DIAGNOSIS — R634 Abnormal weight loss: Secondary | ICD-10-CM | POA: Diagnosis not present

## 2018-03-13 DIAGNOSIS — R64 Cachexia: Secondary | ICD-10-CM | POA: Diagnosis not present

## 2018-03-13 DIAGNOSIS — G4736 Sleep related hypoventilation in conditions classified elsewhere: Secondary | ICD-10-CM | POA: Diagnosis not present

## 2018-03-13 DIAGNOSIS — R1310 Dysphagia, unspecified: Secondary | ICD-10-CM | POA: Diagnosis not present

## 2018-03-13 DIAGNOSIS — Z9989 Dependence on other enabling machines and devices: Secondary | ICD-10-CM | POA: Diagnosis not present

## 2018-03-13 DIAGNOSIS — R471 Dysarthria and anarthria: Secondary | ICD-10-CM | POA: Diagnosis not present

## 2018-03-13 DIAGNOSIS — Z8701 Personal history of pneumonia (recurrent): Secondary | ICD-10-CM | POA: Diagnosis not present

## 2018-03-13 DIAGNOSIS — E0590 Thyrotoxicosis, unspecified without thyrotoxic crisis or storm: Secondary | ICD-10-CM | POA: Diagnosis not present

## 2018-03-13 DIAGNOSIS — G1221 Amyotrophic lateral sclerosis: Secondary | ICD-10-CM | POA: Diagnosis not present

## 2018-03-18 DIAGNOSIS — R471 Dysarthria and anarthria: Secondary | ICD-10-CM | POA: Diagnosis not present

## 2018-03-18 DIAGNOSIS — Z8701 Personal history of pneumonia (recurrent): Secondary | ICD-10-CM | POA: Diagnosis not present

## 2018-03-18 DIAGNOSIS — R1310 Dysphagia, unspecified: Secondary | ICD-10-CM | POA: Diagnosis not present

## 2018-03-18 DIAGNOSIS — G1221 Amyotrophic lateral sclerosis: Secondary | ICD-10-CM | POA: Diagnosis not present

## 2018-03-18 DIAGNOSIS — R634 Abnormal weight loss: Secondary | ICD-10-CM | POA: Diagnosis not present

## 2018-03-18 DIAGNOSIS — G4736 Sleep related hypoventilation in conditions classified elsewhere: Secondary | ICD-10-CM | POA: Diagnosis not present

## 2018-03-24 DIAGNOSIS — R471 Dysarthria and anarthria: Secondary | ICD-10-CM | POA: Diagnosis not present

## 2018-03-24 DIAGNOSIS — Z8701 Personal history of pneumonia (recurrent): Secondary | ICD-10-CM | POA: Diagnosis not present

## 2018-03-24 DIAGNOSIS — R1310 Dysphagia, unspecified: Secondary | ICD-10-CM | POA: Diagnosis not present

## 2018-03-24 DIAGNOSIS — R634 Abnormal weight loss: Secondary | ICD-10-CM | POA: Diagnosis not present

## 2018-03-24 DIAGNOSIS — G1221 Amyotrophic lateral sclerosis: Secondary | ICD-10-CM | POA: Diagnosis not present

## 2018-03-24 DIAGNOSIS — G4736 Sleep related hypoventilation in conditions classified elsewhere: Secondary | ICD-10-CM | POA: Diagnosis not present

## 2018-03-25 DIAGNOSIS — G4736 Sleep related hypoventilation in conditions classified elsewhere: Secondary | ICD-10-CM | POA: Diagnosis not present

## 2018-03-25 DIAGNOSIS — R471 Dysarthria and anarthria: Secondary | ICD-10-CM | POA: Diagnosis not present

## 2018-03-25 DIAGNOSIS — R1310 Dysphagia, unspecified: Secondary | ICD-10-CM | POA: Diagnosis not present

## 2018-03-25 DIAGNOSIS — R634 Abnormal weight loss: Secondary | ICD-10-CM | POA: Diagnosis not present

## 2018-03-25 DIAGNOSIS — Z8701 Personal history of pneumonia (recurrent): Secondary | ICD-10-CM | POA: Diagnosis not present

## 2018-03-25 DIAGNOSIS — G1221 Amyotrophic lateral sclerosis: Secondary | ICD-10-CM | POA: Diagnosis not present

## 2018-03-26 DIAGNOSIS — R1310 Dysphagia, unspecified: Secondary | ICD-10-CM | POA: Diagnosis not present

## 2018-03-26 DIAGNOSIS — R471 Dysarthria and anarthria: Secondary | ICD-10-CM | POA: Diagnosis not present

## 2018-03-26 DIAGNOSIS — R634 Abnormal weight loss: Secondary | ICD-10-CM | POA: Diagnosis not present

## 2018-03-26 DIAGNOSIS — Z8701 Personal history of pneumonia (recurrent): Secondary | ICD-10-CM | POA: Diagnosis not present

## 2018-03-26 DIAGNOSIS — G1221 Amyotrophic lateral sclerosis: Secondary | ICD-10-CM | POA: Diagnosis not present

## 2018-03-26 DIAGNOSIS — G4736 Sleep related hypoventilation in conditions classified elsewhere: Secondary | ICD-10-CM | POA: Diagnosis not present

## 2018-03-27 DIAGNOSIS — G1221 Amyotrophic lateral sclerosis: Secondary | ICD-10-CM | POA: Diagnosis not present

## 2018-03-27 DIAGNOSIS — Z8701 Personal history of pneumonia (recurrent): Secondary | ICD-10-CM | POA: Diagnosis not present

## 2018-03-27 DIAGNOSIS — G4736 Sleep related hypoventilation in conditions classified elsewhere: Secondary | ICD-10-CM | POA: Diagnosis not present

## 2018-03-27 DIAGNOSIS — R471 Dysarthria and anarthria: Secondary | ICD-10-CM | POA: Diagnosis not present

## 2018-03-27 DIAGNOSIS — R1310 Dysphagia, unspecified: Secondary | ICD-10-CM | POA: Diagnosis not present

## 2018-03-27 DIAGNOSIS — R634 Abnormal weight loss: Secondary | ICD-10-CM | POA: Diagnosis not present

## 2018-04-01 DIAGNOSIS — R471 Dysarthria and anarthria: Secondary | ICD-10-CM | POA: Diagnosis not present

## 2018-04-01 DIAGNOSIS — G1221 Amyotrophic lateral sclerosis: Secondary | ICD-10-CM | POA: Diagnosis not present

## 2018-04-01 DIAGNOSIS — G4736 Sleep related hypoventilation in conditions classified elsewhere: Secondary | ICD-10-CM | POA: Diagnosis not present

## 2018-04-01 DIAGNOSIS — Z8701 Personal history of pneumonia (recurrent): Secondary | ICD-10-CM | POA: Diagnosis not present

## 2018-04-01 DIAGNOSIS — R634 Abnormal weight loss: Secondary | ICD-10-CM | POA: Diagnosis not present

## 2018-04-01 DIAGNOSIS — R1310 Dysphagia, unspecified: Secondary | ICD-10-CM | POA: Diagnosis not present

## 2018-04-08 DIAGNOSIS — R1310 Dysphagia, unspecified: Secondary | ICD-10-CM | POA: Diagnosis not present

## 2018-04-08 DIAGNOSIS — G1221 Amyotrophic lateral sclerosis: Secondary | ICD-10-CM | POA: Diagnosis not present

## 2018-04-08 DIAGNOSIS — R634 Abnormal weight loss: Secondary | ICD-10-CM | POA: Diagnosis not present

## 2018-04-08 DIAGNOSIS — Z8701 Personal history of pneumonia (recurrent): Secondary | ICD-10-CM | POA: Diagnosis not present

## 2018-04-08 DIAGNOSIS — R471 Dysarthria and anarthria: Secondary | ICD-10-CM | POA: Diagnosis not present

## 2018-04-08 DIAGNOSIS — G4736 Sleep related hypoventilation in conditions classified elsewhere: Secondary | ICD-10-CM | POA: Diagnosis not present

## 2018-04-12 DIAGNOSIS — R471 Dysarthria and anarthria: Secondary | ICD-10-CM | POA: Diagnosis not present

## 2018-04-12 DIAGNOSIS — R634 Abnormal weight loss: Secondary | ICD-10-CM | POA: Diagnosis not present

## 2018-04-12 DIAGNOSIS — Z9989 Dependence on other enabling machines and devices: Secondary | ICD-10-CM | POA: Diagnosis not present

## 2018-04-12 DIAGNOSIS — E0590 Thyrotoxicosis, unspecified without thyrotoxic crisis or storm: Secondary | ICD-10-CM | POA: Diagnosis not present

## 2018-04-12 DIAGNOSIS — R1310 Dysphagia, unspecified: Secondary | ICD-10-CM | POA: Diagnosis not present

## 2018-04-12 DIAGNOSIS — Z8701 Personal history of pneumonia (recurrent): Secondary | ICD-10-CM | POA: Diagnosis not present

## 2018-04-12 DIAGNOSIS — G1221 Amyotrophic lateral sclerosis: Secondary | ICD-10-CM | POA: Diagnosis not present

## 2018-04-12 DIAGNOSIS — G4736 Sleep related hypoventilation in conditions classified elsewhere: Secondary | ICD-10-CM | POA: Diagnosis not present

## 2018-04-12 DIAGNOSIS — R64 Cachexia: Secondary | ICD-10-CM | POA: Diagnosis not present

## 2018-04-15 DIAGNOSIS — R471 Dysarthria and anarthria: Secondary | ICD-10-CM | POA: Diagnosis not present

## 2018-04-15 DIAGNOSIS — Z8701 Personal history of pneumonia (recurrent): Secondary | ICD-10-CM | POA: Diagnosis not present

## 2018-04-15 DIAGNOSIS — G4736 Sleep related hypoventilation in conditions classified elsewhere: Secondary | ICD-10-CM | POA: Diagnosis not present

## 2018-04-15 DIAGNOSIS — R1310 Dysphagia, unspecified: Secondary | ICD-10-CM | POA: Diagnosis not present

## 2018-04-15 DIAGNOSIS — R634 Abnormal weight loss: Secondary | ICD-10-CM | POA: Diagnosis not present

## 2018-04-15 DIAGNOSIS — G1221 Amyotrophic lateral sclerosis: Secondary | ICD-10-CM | POA: Diagnosis not present

## 2018-04-17 DIAGNOSIS — R634 Abnormal weight loss: Secondary | ICD-10-CM | POA: Diagnosis not present

## 2018-04-17 DIAGNOSIS — Z8701 Personal history of pneumonia (recurrent): Secondary | ICD-10-CM | POA: Diagnosis not present

## 2018-04-17 DIAGNOSIS — R471 Dysarthria and anarthria: Secondary | ICD-10-CM | POA: Diagnosis not present

## 2018-04-17 DIAGNOSIS — G4736 Sleep related hypoventilation in conditions classified elsewhere: Secondary | ICD-10-CM | POA: Diagnosis not present

## 2018-04-17 DIAGNOSIS — R1310 Dysphagia, unspecified: Secondary | ICD-10-CM | POA: Diagnosis not present

## 2018-04-17 DIAGNOSIS — G1221 Amyotrophic lateral sclerosis: Secondary | ICD-10-CM | POA: Diagnosis not present

## 2018-04-22 DIAGNOSIS — R1310 Dysphagia, unspecified: Secondary | ICD-10-CM | POA: Diagnosis not present

## 2018-04-22 DIAGNOSIS — Z8701 Personal history of pneumonia (recurrent): Secondary | ICD-10-CM | POA: Diagnosis not present

## 2018-04-22 DIAGNOSIS — G1221 Amyotrophic lateral sclerosis: Secondary | ICD-10-CM | POA: Diagnosis not present

## 2018-04-22 DIAGNOSIS — G4736 Sleep related hypoventilation in conditions classified elsewhere: Secondary | ICD-10-CM | POA: Diagnosis not present

## 2018-04-22 DIAGNOSIS — R471 Dysarthria and anarthria: Secondary | ICD-10-CM | POA: Diagnosis not present

## 2018-04-22 DIAGNOSIS — R634 Abnormal weight loss: Secondary | ICD-10-CM | POA: Diagnosis not present

## 2018-04-29 DIAGNOSIS — G4736 Sleep related hypoventilation in conditions classified elsewhere: Secondary | ICD-10-CM | POA: Diagnosis not present

## 2018-04-29 DIAGNOSIS — R0989 Other specified symptoms and signs involving the circulatory and respiratory systems: Secondary | ICD-10-CM | POA: Diagnosis not present

## 2018-04-29 DIAGNOSIS — R1310 Dysphagia, unspecified: Secondary | ICD-10-CM | POA: Diagnosis not present

## 2018-04-29 DIAGNOSIS — Z79899 Other long term (current) drug therapy: Secondary | ICD-10-CM | POA: Diagnosis not present

## 2018-04-29 DIAGNOSIS — R634 Abnormal weight loss: Secondary | ICD-10-CM | POA: Diagnosis not present

## 2018-04-29 DIAGNOSIS — Z8701 Personal history of pneumonia (recurrent): Secondary | ICD-10-CM | POA: Diagnosis not present

## 2018-04-29 DIAGNOSIS — R471 Dysarthria and anarthria: Secondary | ICD-10-CM | POA: Diagnosis not present

## 2018-04-29 DIAGNOSIS — G1221 Amyotrophic lateral sclerosis: Secondary | ICD-10-CM | POA: Diagnosis not present

## 2018-04-30 DIAGNOSIS — R634 Abnormal weight loss: Secondary | ICD-10-CM | POA: Diagnosis not present

## 2018-04-30 DIAGNOSIS — R471 Dysarthria and anarthria: Secondary | ICD-10-CM | POA: Diagnosis not present

## 2018-04-30 DIAGNOSIS — G4736 Sleep related hypoventilation in conditions classified elsewhere: Secondary | ICD-10-CM | POA: Diagnosis not present

## 2018-04-30 DIAGNOSIS — R1310 Dysphagia, unspecified: Secondary | ICD-10-CM | POA: Diagnosis not present

## 2018-04-30 DIAGNOSIS — G1221 Amyotrophic lateral sclerosis: Secondary | ICD-10-CM | POA: Diagnosis not present

## 2018-04-30 DIAGNOSIS — Z8701 Personal history of pneumonia (recurrent): Secondary | ICD-10-CM | POA: Diagnosis not present

## 2018-05-05 DIAGNOSIS — Z8701 Personal history of pneumonia (recurrent): Secondary | ICD-10-CM | POA: Diagnosis not present

## 2018-05-05 DIAGNOSIS — R471 Dysarthria and anarthria: Secondary | ICD-10-CM | POA: Diagnosis not present

## 2018-05-05 DIAGNOSIS — G4736 Sleep related hypoventilation in conditions classified elsewhere: Secondary | ICD-10-CM | POA: Diagnosis not present

## 2018-05-05 DIAGNOSIS — R1310 Dysphagia, unspecified: Secondary | ICD-10-CM | POA: Diagnosis not present

## 2018-05-05 DIAGNOSIS — R634 Abnormal weight loss: Secondary | ICD-10-CM | POA: Diagnosis not present

## 2018-05-05 DIAGNOSIS — G1221 Amyotrophic lateral sclerosis: Secondary | ICD-10-CM | POA: Diagnosis not present

## 2018-05-08 DIAGNOSIS — G1221 Amyotrophic lateral sclerosis: Secondary | ICD-10-CM | POA: Diagnosis not present

## 2018-05-08 DIAGNOSIS — R1310 Dysphagia, unspecified: Secondary | ICD-10-CM | POA: Diagnosis not present

## 2018-05-08 DIAGNOSIS — R471 Dysarthria and anarthria: Secondary | ICD-10-CM | POA: Diagnosis not present

## 2018-05-08 DIAGNOSIS — R634 Abnormal weight loss: Secondary | ICD-10-CM | POA: Diagnosis not present

## 2018-05-08 DIAGNOSIS — Z8701 Personal history of pneumonia (recurrent): Secondary | ICD-10-CM | POA: Diagnosis not present

## 2018-05-08 DIAGNOSIS — G4736 Sleep related hypoventilation in conditions classified elsewhere: Secondary | ICD-10-CM | POA: Diagnosis not present

## 2018-05-13 DIAGNOSIS — G1221 Amyotrophic lateral sclerosis: Secondary | ICD-10-CM | POA: Diagnosis not present

## 2018-05-13 DIAGNOSIS — Z8701 Personal history of pneumonia (recurrent): Secondary | ICD-10-CM | POA: Diagnosis not present

## 2018-05-13 DIAGNOSIS — Z9989 Dependence on other enabling machines and devices: Secondary | ICD-10-CM | POA: Diagnosis not present

## 2018-05-13 DIAGNOSIS — R471 Dysarthria and anarthria: Secondary | ICD-10-CM | POA: Diagnosis not present

## 2018-05-13 DIAGNOSIS — E0590 Thyrotoxicosis, unspecified without thyrotoxic crisis or storm: Secondary | ICD-10-CM | POA: Diagnosis not present

## 2018-05-13 DIAGNOSIS — R1310 Dysphagia, unspecified: Secondary | ICD-10-CM | POA: Diagnosis not present

## 2018-05-13 DIAGNOSIS — G4736 Sleep related hypoventilation in conditions classified elsewhere: Secondary | ICD-10-CM | POA: Diagnosis not present

## 2018-05-13 DIAGNOSIS — R634 Abnormal weight loss: Secondary | ICD-10-CM | POA: Diagnosis not present

## 2018-05-13 DIAGNOSIS — R64 Cachexia: Secondary | ICD-10-CM | POA: Diagnosis not present

## 2018-05-14 DIAGNOSIS — G1221 Amyotrophic lateral sclerosis: Secondary | ICD-10-CM | POA: Diagnosis not present

## 2018-05-14 DIAGNOSIS — R471 Dysarthria and anarthria: Secondary | ICD-10-CM | POA: Diagnosis not present

## 2018-05-14 DIAGNOSIS — G4736 Sleep related hypoventilation in conditions classified elsewhere: Secondary | ICD-10-CM | POA: Diagnosis not present

## 2018-05-14 DIAGNOSIS — R1310 Dysphagia, unspecified: Secondary | ICD-10-CM | POA: Diagnosis not present

## 2018-05-14 DIAGNOSIS — Z8701 Personal history of pneumonia (recurrent): Secondary | ICD-10-CM | POA: Diagnosis not present

## 2018-05-14 DIAGNOSIS — R634 Abnormal weight loss: Secondary | ICD-10-CM | POA: Diagnosis not present

## 2018-05-20 DIAGNOSIS — R471 Dysarthria and anarthria: Secondary | ICD-10-CM | POA: Diagnosis not present

## 2018-05-20 DIAGNOSIS — G4736 Sleep related hypoventilation in conditions classified elsewhere: Secondary | ICD-10-CM | POA: Diagnosis not present

## 2018-05-20 DIAGNOSIS — R634 Abnormal weight loss: Secondary | ICD-10-CM | POA: Diagnosis not present

## 2018-05-20 DIAGNOSIS — R1310 Dysphagia, unspecified: Secondary | ICD-10-CM | POA: Diagnosis not present

## 2018-05-20 DIAGNOSIS — G1221 Amyotrophic lateral sclerosis: Secondary | ICD-10-CM | POA: Diagnosis not present

## 2018-05-20 DIAGNOSIS — Z8701 Personal history of pneumonia (recurrent): Secondary | ICD-10-CM | POA: Diagnosis not present

## 2018-05-21 DIAGNOSIS — G4736 Sleep related hypoventilation in conditions classified elsewhere: Secondary | ICD-10-CM | POA: Diagnosis not present

## 2018-05-21 DIAGNOSIS — R1310 Dysphagia, unspecified: Secondary | ICD-10-CM | POA: Diagnosis not present

## 2018-05-21 DIAGNOSIS — R634 Abnormal weight loss: Secondary | ICD-10-CM | POA: Diagnosis not present

## 2018-05-21 DIAGNOSIS — G1221 Amyotrophic lateral sclerosis: Secondary | ICD-10-CM | POA: Diagnosis not present

## 2018-05-21 DIAGNOSIS — R471 Dysarthria and anarthria: Secondary | ICD-10-CM | POA: Diagnosis not present

## 2018-05-21 DIAGNOSIS — Z8701 Personal history of pneumonia (recurrent): Secondary | ICD-10-CM | POA: Diagnosis not present

## 2018-05-28 DIAGNOSIS — G1221 Amyotrophic lateral sclerosis: Secondary | ICD-10-CM | POA: Diagnosis not present

## 2018-05-28 DIAGNOSIS — R471 Dysarthria and anarthria: Secondary | ICD-10-CM | POA: Diagnosis not present

## 2018-05-28 DIAGNOSIS — G4736 Sleep related hypoventilation in conditions classified elsewhere: Secondary | ICD-10-CM | POA: Diagnosis not present

## 2018-05-28 DIAGNOSIS — R634 Abnormal weight loss: Secondary | ICD-10-CM | POA: Diagnosis not present

## 2018-05-28 DIAGNOSIS — R1310 Dysphagia, unspecified: Secondary | ICD-10-CM | POA: Diagnosis not present

## 2018-05-28 DIAGNOSIS — Z8701 Personal history of pneumonia (recurrent): Secondary | ICD-10-CM | POA: Diagnosis not present

## 2018-06-04 DIAGNOSIS — R471 Dysarthria and anarthria: Secondary | ICD-10-CM | POA: Diagnosis not present

## 2018-06-04 DIAGNOSIS — G4736 Sleep related hypoventilation in conditions classified elsewhere: Secondary | ICD-10-CM | POA: Diagnosis not present

## 2018-06-04 DIAGNOSIS — G1221 Amyotrophic lateral sclerosis: Secondary | ICD-10-CM | POA: Diagnosis not present

## 2018-06-04 DIAGNOSIS — Z8701 Personal history of pneumonia (recurrent): Secondary | ICD-10-CM | POA: Diagnosis not present

## 2018-06-04 DIAGNOSIS — R1310 Dysphagia, unspecified: Secondary | ICD-10-CM | POA: Diagnosis not present

## 2018-06-04 DIAGNOSIS — R634 Abnormal weight loss: Secondary | ICD-10-CM | POA: Diagnosis not present

## 2018-06-11 DIAGNOSIS — R634 Abnormal weight loss: Secondary | ICD-10-CM | POA: Diagnosis not present

## 2018-06-11 DIAGNOSIS — G4736 Sleep related hypoventilation in conditions classified elsewhere: Secondary | ICD-10-CM | POA: Diagnosis not present

## 2018-06-11 DIAGNOSIS — R1310 Dysphagia, unspecified: Secondary | ICD-10-CM | POA: Diagnosis not present

## 2018-06-11 DIAGNOSIS — Z8701 Personal history of pneumonia (recurrent): Secondary | ICD-10-CM | POA: Diagnosis not present

## 2018-06-11 DIAGNOSIS — G1221 Amyotrophic lateral sclerosis: Secondary | ICD-10-CM | POA: Diagnosis not present

## 2018-06-11 DIAGNOSIS — R471 Dysarthria and anarthria: Secondary | ICD-10-CM | POA: Diagnosis not present

## 2018-06-13 DIAGNOSIS — E0590 Thyrotoxicosis, unspecified without thyrotoxic crisis or storm: Secondary | ICD-10-CM | POA: Diagnosis not present

## 2018-06-13 DIAGNOSIS — G1221 Amyotrophic lateral sclerosis: Secondary | ICD-10-CM | POA: Diagnosis not present

## 2018-06-13 DIAGNOSIS — Z8701 Personal history of pneumonia (recurrent): Secondary | ICD-10-CM | POA: Diagnosis not present

## 2018-06-13 DIAGNOSIS — G4736 Sleep related hypoventilation in conditions classified elsewhere: Secondary | ICD-10-CM | POA: Diagnosis not present

## 2018-06-13 DIAGNOSIS — R1310 Dysphagia, unspecified: Secondary | ICD-10-CM | POA: Diagnosis not present

## 2018-06-13 DIAGNOSIS — R64 Cachexia: Secondary | ICD-10-CM | POA: Diagnosis not present

## 2018-06-13 DIAGNOSIS — R471 Dysarthria and anarthria: Secondary | ICD-10-CM | POA: Diagnosis not present

## 2018-06-13 DIAGNOSIS — R634 Abnormal weight loss: Secondary | ICD-10-CM | POA: Diagnosis not present

## 2018-06-13 DIAGNOSIS — Z9989 Dependence on other enabling machines and devices: Secondary | ICD-10-CM | POA: Diagnosis not present

## 2018-06-15 DIAGNOSIS — G1221 Amyotrophic lateral sclerosis: Secondary | ICD-10-CM | POA: Diagnosis not present

## 2018-06-15 DIAGNOSIS — Z8701 Personal history of pneumonia (recurrent): Secondary | ICD-10-CM | POA: Diagnosis not present

## 2018-06-15 DIAGNOSIS — R634 Abnormal weight loss: Secondary | ICD-10-CM | POA: Diagnosis not present

## 2018-06-15 DIAGNOSIS — R1310 Dysphagia, unspecified: Secondary | ICD-10-CM | POA: Diagnosis not present

## 2018-06-15 DIAGNOSIS — R471 Dysarthria and anarthria: Secondary | ICD-10-CM | POA: Diagnosis not present

## 2018-06-15 DIAGNOSIS — G4736 Sleep related hypoventilation in conditions classified elsewhere: Secondary | ICD-10-CM | POA: Diagnosis not present

## 2018-06-18 DIAGNOSIS — G1221 Amyotrophic lateral sclerosis: Secondary | ICD-10-CM | POA: Diagnosis not present

## 2018-06-18 DIAGNOSIS — Z8701 Personal history of pneumonia (recurrent): Secondary | ICD-10-CM | POA: Diagnosis not present

## 2018-06-18 DIAGNOSIS — R634 Abnormal weight loss: Secondary | ICD-10-CM | POA: Diagnosis not present

## 2018-06-18 DIAGNOSIS — R471 Dysarthria and anarthria: Secondary | ICD-10-CM | POA: Diagnosis not present

## 2018-06-18 DIAGNOSIS — G4736 Sleep related hypoventilation in conditions classified elsewhere: Secondary | ICD-10-CM | POA: Diagnosis not present

## 2018-06-18 DIAGNOSIS — R1310 Dysphagia, unspecified: Secondary | ICD-10-CM | POA: Diagnosis not present

## 2018-06-20 DIAGNOSIS — Z8701 Personal history of pneumonia (recurrent): Secondary | ICD-10-CM | POA: Diagnosis not present

## 2018-06-20 DIAGNOSIS — G1221 Amyotrophic lateral sclerosis: Secondary | ICD-10-CM | POA: Diagnosis not present

## 2018-06-20 DIAGNOSIS — R471 Dysarthria and anarthria: Secondary | ICD-10-CM | POA: Diagnosis not present

## 2018-06-20 DIAGNOSIS — G4736 Sleep related hypoventilation in conditions classified elsewhere: Secondary | ICD-10-CM | POA: Diagnosis not present

## 2018-06-20 DIAGNOSIS — R634 Abnormal weight loss: Secondary | ICD-10-CM | POA: Diagnosis not present

## 2018-06-20 DIAGNOSIS — R1310 Dysphagia, unspecified: Secondary | ICD-10-CM | POA: Diagnosis not present

## 2018-06-25 DIAGNOSIS — R634 Abnormal weight loss: Secondary | ICD-10-CM | POA: Diagnosis not present

## 2018-06-25 DIAGNOSIS — R1310 Dysphagia, unspecified: Secondary | ICD-10-CM | POA: Diagnosis not present

## 2018-06-25 DIAGNOSIS — Z8701 Personal history of pneumonia (recurrent): Secondary | ICD-10-CM | POA: Diagnosis not present

## 2018-06-25 DIAGNOSIS — G4736 Sleep related hypoventilation in conditions classified elsewhere: Secondary | ICD-10-CM | POA: Diagnosis not present

## 2018-06-25 DIAGNOSIS — G1221 Amyotrophic lateral sclerosis: Secondary | ICD-10-CM | POA: Diagnosis not present

## 2018-06-25 DIAGNOSIS — R471 Dysarthria and anarthria: Secondary | ICD-10-CM | POA: Diagnosis not present

## 2018-06-26 DIAGNOSIS — Z8701 Personal history of pneumonia (recurrent): Secondary | ICD-10-CM | POA: Diagnosis not present

## 2018-06-26 DIAGNOSIS — R471 Dysarthria and anarthria: Secondary | ICD-10-CM | POA: Diagnosis not present

## 2018-06-26 DIAGNOSIS — G4736 Sleep related hypoventilation in conditions classified elsewhere: Secondary | ICD-10-CM | POA: Diagnosis not present

## 2018-06-26 DIAGNOSIS — R1310 Dysphagia, unspecified: Secondary | ICD-10-CM | POA: Diagnosis not present

## 2018-06-26 DIAGNOSIS — G1221 Amyotrophic lateral sclerosis: Secondary | ICD-10-CM | POA: Diagnosis not present

## 2018-06-26 DIAGNOSIS — R634 Abnormal weight loss: Secondary | ICD-10-CM | POA: Diagnosis not present

## 2018-07-02 DIAGNOSIS — Z8701 Personal history of pneumonia (recurrent): Secondary | ICD-10-CM | POA: Diagnosis not present

## 2018-07-02 DIAGNOSIS — R634 Abnormal weight loss: Secondary | ICD-10-CM | POA: Diagnosis not present

## 2018-07-02 DIAGNOSIS — G1221 Amyotrophic lateral sclerosis: Secondary | ICD-10-CM | POA: Diagnosis not present

## 2018-07-02 DIAGNOSIS — G4736 Sleep related hypoventilation in conditions classified elsewhere: Secondary | ICD-10-CM | POA: Diagnosis not present

## 2018-07-02 DIAGNOSIS — R1310 Dysphagia, unspecified: Secondary | ICD-10-CM | POA: Diagnosis not present

## 2018-07-02 DIAGNOSIS — R471 Dysarthria and anarthria: Secondary | ICD-10-CM | POA: Diagnosis not present

## 2018-07-09 DIAGNOSIS — G1221 Amyotrophic lateral sclerosis: Secondary | ICD-10-CM | POA: Diagnosis not present

## 2018-07-09 DIAGNOSIS — G4736 Sleep related hypoventilation in conditions classified elsewhere: Secondary | ICD-10-CM | POA: Diagnosis not present

## 2018-07-09 DIAGNOSIS — R634 Abnormal weight loss: Secondary | ICD-10-CM | POA: Diagnosis not present

## 2018-07-09 DIAGNOSIS — R1310 Dysphagia, unspecified: Secondary | ICD-10-CM | POA: Diagnosis not present

## 2018-07-09 DIAGNOSIS — Z8701 Personal history of pneumonia (recurrent): Secondary | ICD-10-CM | POA: Diagnosis not present

## 2018-07-09 DIAGNOSIS — R471 Dysarthria and anarthria: Secondary | ICD-10-CM | POA: Diagnosis not present

## 2018-07-12 DIAGNOSIS — G1221 Amyotrophic lateral sclerosis: Secondary | ICD-10-CM | POA: Diagnosis not present

## 2018-07-12 DIAGNOSIS — Z8701 Personal history of pneumonia (recurrent): Secondary | ICD-10-CM | POA: Diagnosis not present

## 2018-07-12 DIAGNOSIS — R471 Dysarthria and anarthria: Secondary | ICD-10-CM | POA: Diagnosis not present

## 2018-07-12 DIAGNOSIS — R634 Abnormal weight loss: Secondary | ICD-10-CM | POA: Diagnosis not present

## 2018-07-12 DIAGNOSIS — Z9989 Dependence on other enabling machines and devices: Secondary | ICD-10-CM | POA: Diagnosis not present

## 2018-07-12 DIAGNOSIS — R1310 Dysphagia, unspecified: Secondary | ICD-10-CM | POA: Diagnosis not present

## 2018-07-12 DIAGNOSIS — G4736 Sleep related hypoventilation in conditions classified elsewhere: Secondary | ICD-10-CM | POA: Diagnosis not present

## 2018-07-12 DIAGNOSIS — R64 Cachexia: Secondary | ICD-10-CM | POA: Diagnosis not present

## 2018-07-12 DIAGNOSIS — E0590 Thyrotoxicosis, unspecified without thyrotoxic crisis or storm: Secondary | ICD-10-CM | POA: Diagnosis not present

## 2018-07-16 DIAGNOSIS — G4736 Sleep related hypoventilation in conditions classified elsewhere: Secondary | ICD-10-CM | POA: Diagnosis not present

## 2018-07-16 DIAGNOSIS — G1221 Amyotrophic lateral sclerosis: Secondary | ICD-10-CM | POA: Diagnosis not present

## 2018-07-16 DIAGNOSIS — R471 Dysarthria and anarthria: Secondary | ICD-10-CM | POA: Diagnosis not present

## 2018-07-16 DIAGNOSIS — Z8701 Personal history of pneumonia (recurrent): Secondary | ICD-10-CM | POA: Diagnosis not present

## 2018-07-16 DIAGNOSIS — R1310 Dysphagia, unspecified: Secondary | ICD-10-CM | POA: Diagnosis not present

## 2018-07-16 DIAGNOSIS — R634 Abnormal weight loss: Secondary | ICD-10-CM | POA: Diagnosis not present

## 2018-07-17 DIAGNOSIS — G4736 Sleep related hypoventilation in conditions classified elsewhere: Secondary | ICD-10-CM | POA: Diagnosis not present

## 2018-07-17 DIAGNOSIS — R634 Abnormal weight loss: Secondary | ICD-10-CM | POA: Diagnosis not present

## 2018-07-17 DIAGNOSIS — G1221 Amyotrophic lateral sclerosis: Secondary | ICD-10-CM | POA: Diagnosis not present

## 2018-07-17 DIAGNOSIS — Z8701 Personal history of pneumonia (recurrent): Secondary | ICD-10-CM | POA: Diagnosis not present

## 2018-07-17 DIAGNOSIS — R471 Dysarthria and anarthria: Secondary | ICD-10-CM | POA: Diagnosis not present

## 2018-07-17 DIAGNOSIS — R1310 Dysphagia, unspecified: Secondary | ICD-10-CM | POA: Diagnosis not present

## 2018-07-23 DIAGNOSIS — G1221 Amyotrophic lateral sclerosis: Secondary | ICD-10-CM | POA: Diagnosis not present

## 2018-07-23 DIAGNOSIS — Z8701 Personal history of pneumonia (recurrent): Secondary | ICD-10-CM | POA: Diagnosis not present

## 2018-07-23 DIAGNOSIS — R634 Abnormal weight loss: Secondary | ICD-10-CM | POA: Diagnosis not present

## 2018-07-23 DIAGNOSIS — G4736 Sleep related hypoventilation in conditions classified elsewhere: Secondary | ICD-10-CM | POA: Diagnosis not present

## 2018-07-23 DIAGNOSIS — R471 Dysarthria and anarthria: Secondary | ICD-10-CM | POA: Diagnosis not present

## 2018-07-23 DIAGNOSIS — R1310 Dysphagia, unspecified: Secondary | ICD-10-CM | POA: Diagnosis not present

## 2018-07-24 DIAGNOSIS — R634 Abnormal weight loss: Secondary | ICD-10-CM | POA: Diagnosis not present

## 2018-07-24 DIAGNOSIS — Z8701 Personal history of pneumonia (recurrent): Secondary | ICD-10-CM | POA: Diagnosis not present

## 2018-07-24 DIAGNOSIS — R471 Dysarthria and anarthria: Secondary | ICD-10-CM | POA: Diagnosis not present

## 2018-07-24 DIAGNOSIS — G1221 Amyotrophic lateral sclerosis: Secondary | ICD-10-CM | POA: Diagnosis not present

## 2018-07-24 DIAGNOSIS — G4736 Sleep related hypoventilation in conditions classified elsewhere: Secondary | ICD-10-CM | POA: Diagnosis not present

## 2018-07-24 DIAGNOSIS — R1310 Dysphagia, unspecified: Secondary | ICD-10-CM | POA: Diagnosis not present

## 2018-07-30 DIAGNOSIS — G1221 Amyotrophic lateral sclerosis: Secondary | ICD-10-CM | POA: Diagnosis not present

## 2018-07-30 DIAGNOSIS — G4736 Sleep related hypoventilation in conditions classified elsewhere: Secondary | ICD-10-CM | POA: Diagnosis not present

## 2018-07-30 DIAGNOSIS — R1310 Dysphagia, unspecified: Secondary | ICD-10-CM | POA: Diagnosis not present

## 2018-07-30 DIAGNOSIS — R471 Dysarthria and anarthria: Secondary | ICD-10-CM | POA: Diagnosis not present

## 2018-07-30 DIAGNOSIS — Z8701 Personal history of pneumonia (recurrent): Secondary | ICD-10-CM | POA: Diagnosis not present

## 2018-07-30 DIAGNOSIS — R634 Abnormal weight loss: Secondary | ICD-10-CM | POA: Diagnosis not present

## 2018-08-05 DIAGNOSIS — G1221 Amyotrophic lateral sclerosis: Secondary | ICD-10-CM | POA: Diagnosis not present

## 2018-08-06 DIAGNOSIS — R634 Abnormal weight loss: Secondary | ICD-10-CM | POA: Diagnosis not present

## 2018-08-06 DIAGNOSIS — Z8701 Personal history of pneumonia (recurrent): Secondary | ICD-10-CM | POA: Diagnosis not present

## 2018-08-06 DIAGNOSIS — G4736 Sleep related hypoventilation in conditions classified elsewhere: Secondary | ICD-10-CM | POA: Diagnosis not present

## 2018-08-06 DIAGNOSIS — G1221 Amyotrophic lateral sclerosis: Secondary | ICD-10-CM | POA: Diagnosis not present

## 2018-08-06 DIAGNOSIS — R471 Dysarthria and anarthria: Secondary | ICD-10-CM | POA: Diagnosis not present

## 2018-08-06 DIAGNOSIS — R1310 Dysphagia, unspecified: Secondary | ICD-10-CM | POA: Diagnosis not present

## 2018-08-12 DIAGNOSIS — R64 Cachexia: Secondary | ICD-10-CM | POA: Diagnosis not present

## 2018-08-12 DIAGNOSIS — E0590 Thyrotoxicosis, unspecified without thyrotoxic crisis or storm: Secondary | ICD-10-CM | POA: Diagnosis not present

## 2018-08-12 DIAGNOSIS — R634 Abnormal weight loss: Secondary | ICD-10-CM | POA: Diagnosis not present

## 2018-08-12 DIAGNOSIS — Z9989 Dependence on other enabling machines and devices: Secondary | ICD-10-CM | POA: Diagnosis not present

## 2018-08-12 DIAGNOSIS — R1310 Dysphagia, unspecified: Secondary | ICD-10-CM | POA: Diagnosis not present

## 2018-08-12 DIAGNOSIS — Z8701 Personal history of pneumonia (recurrent): Secondary | ICD-10-CM | POA: Diagnosis not present

## 2018-08-12 DIAGNOSIS — R471 Dysarthria and anarthria: Secondary | ICD-10-CM | POA: Diagnosis not present

## 2018-08-12 DIAGNOSIS — G1221 Amyotrophic lateral sclerosis: Secondary | ICD-10-CM | POA: Diagnosis not present

## 2018-08-12 DIAGNOSIS — G4736 Sleep related hypoventilation in conditions classified elsewhere: Secondary | ICD-10-CM | POA: Diagnosis not present

## 2018-08-13 DIAGNOSIS — R471 Dysarthria and anarthria: Secondary | ICD-10-CM | POA: Diagnosis not present

## 2018-08-13 DIAGNOSIS — R634 Abnormal weight loss: Secondary | ICD-10-CM | POA: Diagnosis not present

## 2018-08-13 DIAGNOSIS — G1221 Amyotrophic lateral sclerosis: Secondary | ICD-10-CM | POA: Diagnosis not present

## 2018-08-13 DIAGNOSIS — G4736 Sleep related hypoventilation in conditions classified elsewhere: Secondary | ICD-10-CM | POA: Diagnosis not present

## 2018-08-13 DIAGNOSIS — R1310 Dysphagia, unspecified: Secondary | ICD-10-CM | POA: Diagnosis not present

## 2018-08-13 DIAGNOSIS — Z8701 Personal history of pneumonia (recurrent): Secondary | ICD-10-CM | POA: Diagnosis not present

## 2018-08-21 DIAGNOSIS — G4736 Sleep related hypoventilation in conditions classified elsewhere: Secondary | ICD-10-CM | POA: Diagnosis not present

## 2018-08-21 DIAGNOSIS — R1310 Dysphagia, unspecified: Secondary | ICD-10-CM | POA: Diagnosis not present

## 2018-08-21 DIAGNOSIS — Z8701 Personal history of pneumonia (recurrent): Secondary | ICD-10-CM | POA: Diagnosis not present

## 2018-08-21 DIAGNOSIS — G1221 Amyotrophic lateral sclerosis: Secondary | ICD-10-CM | POA: Diagnosis not present

## 2018-08-21 DIAGNOSIS — R471 Dysarthria and anarthria: Secondary | ICD-10-CM | POA: Diagnosis not present

## 2018-08-21 DIAGNOSIS — R634 Abnormal weight loss: Secondary | ICD-10-CM | POA: Diagnosis not present

## 2018-09-11 DIAGNOSIS — R64 Cachexia: Secondary | ICD-10-CM | POA: Diagnosis not present

## 2018-09-11 DIAGNOSIS — R634 Abnormal weight loss: Secondary | ICD-10-CM | POA: Diagnosis not present

## 2018-09-11 DIAGNOSIS — Z9989 Dependence on other enabling machines and devices: Secondary | ICD-10-CM | POA: Diagnosis not present

## 2018-09-11 DIAGNOSIS — G1221 Amyotrophic lateral sclerosis: Secondary | ICD-10-CM | POA: Diagnosis not present

## 2018-09-11 DIAGNOSIS — Z8701 Personal history of pneumonia (recurrent): Secondary | ICD-10-CM | POA: Diagnosis not present

## 2018-09-11 DIAGNOSIS — E0590 Thyrotoxicosis, unspecified without thyrotoxic crisis or storm: Secondary | ICD-10-CM | POA: Diagnosis not present

## 2018-09-11 DIAGNOSIS — R471 Dysarthria and anarthria: Secondary | ICD-10-CM | POA: Diagnosis not present

## 2018-09-11 DIAGNOSIS — R1310 Dysphagia, unspecified: Secondary | ICD-10-CM | POA: Diagnosis not present

## 2018-09-11 DIAGNOSIS — G4736 Sleep related hypoventilation in conditions classified elsewhere: Secondary | ICD-10-CM | POA: Diagnosis not present

## 2018-09-25 DIAGNOSIS — G1221 Amyotrophic lateral sclerosis: Secondary | ICD-10-CM | POA: Diagnosis not present

## 2018-09-25 DIAGNOSIS — R634 Abnormal weight loss: Secondary | ICD-10-CM | POA: Diagnosis not present

## 2018-09-25 DIAGNOSIS — G4736 Sleep related hypoventilation in conditions classified elsewhere: Secondary | ICD-10-CM | POA: Diagnosis not present

## 2018-09-25 DIAGNOSIS — R1310 Dysphagia, unspecified: Secondary | ICD-10-CM | POA: Diagnosis not present

## 2018-09-25 DIAGNOSIS — Z8701 Personal history of pneumonia (recurrent): Secondary | ICD-10-CM | POA: Diagnosis not present

## 2018-09-25 DIAGNOSIS — R471 Dysarthria and anarthria: Secondary | ICD-10-CM | POA: Diagnosis not present

## 2018-10-08 DIAGNOSIS — R1310 Dysphagia, unspecified: Secondary | ICD-10-CM | POA: Diagnosis not present

## 2018-10-08 DIAGNOSIS — Z8701 Personal history of pneumonia (recurrent): Secondary | ICD-10-CM | POA: Diagnosis not present

## 2018-10-08 DIAGNOSIS — R471 Dysarthria and anarthria: Secondary | ICD-10-CM | POA: Diagnosis not present

## 2018-10-08 DIAGNOSIS — G1221 Amyotrophic lateral sclerosis: Secondary | ICD-10-CM | POA: Diagnosis not present

## 2018-10-08 DIAGNOSIS — G4736 Sleep related hypoventilation in conditions classified elsewhere: Secondary | ICD-10-CM | POA: Diagnosis not present

## 2018-10-08 DIAGNOSIS — R634 Abnormal weight loss: Secondary | ICD-10-CM | POA: Diagnosis not present

## 2018-10-12 DIAGNOSIS — Z9989 Dependence on other enabling machines and devices: Secondary | ICD-10-CM | POA: Diagnosis not present

## 2018-10-12 DIAGNOSIS — R1310 Dysphagia, unspecified: Secondary | ICD-10-CM | POA: Diagnosis not present

## 2018-10-12 DIAGNOSIS — G4736 Sleep related hypoventilation in conditions classified elsewhere: Secondary | ICD-10-CM | POA: Diagnosis not present

## 2018-10-12 DIAGNOSIS — Z8701 Personal history of pneumonia (recurrent): Secondary | ICD-10-CM | POA: Diagnosis not present

## 2018-10-12 DIAGNOSIS — R634 Abnormal weight loss: Secondary | ICD-10-CM | POA: Diagnosis not present

## 2018-10-12 DIAGNOSIS — E0590 Thyrotoxicosis, unspecified without thyrotoxic crisis or storm: Secondary | ICD-10-CM | POA: Diagnosis not present

## 2018-10-12 DIAGNOSIS — R471 Dysarthria and anarthria: Secondary | ICD-10-CM | POA: Diagnosis not present

## 2018-10-12 DIAGNOSIS — G1221 Amyotrophic lateral sclerosis: Secondary | ICD-10-CM | POA: Diagnosis not present

## 2018-10-12 DIAGNOSIS — R64 Cachexia: Secondary | ICD-10-CM | POA: Diagnosis not present

## 2018-10-16 DIAGNOSIS — R1310 Dysphagia, unspecified: Secondary | ICD-10-CM | POA: Diagnosis not present

## 2018-10-16 DIAGNOSIS — R634 Abnormal weight loss: Secondary | ICD-10-CM | POA: Diagnosis not present

## 2018-10-16 DIAGNOSIS — G4736 Sleep related hypoventilation in conditions classified elsewhere: Secondary | ICD-10-CM | POA: Diagnosis not present

## 2018-10-16 DIAGNOSIS — R471 Dysarthria and anarthria: Secondary | ICD-10-CM | POA: Diagnosis not present

## 2018-10-16 DIAGNOSIS — Z8701 Personal history of pneumonia (recurrent): Secondary | ICD-10-CM | POA: Diagnosis not present

## 2018-10-16 DIAGNOSIS — G1221 Amyotrophic lateral sclerosis: Secondary | ICD-10-CM | POA: Diagnosis not present

## 2018-10-23 DIAGNOSIS — G1221 Amyotrophic lateral sclerosis: Secondary | ICD-10-CM | POA: Diagnosis not present

## 2018-10-23 DIAGNOSIS — R634 Abnormal weight loss: Secondary | ICD-10-CM | POA: Diagnosis not present

## 2018-10-23 DIAGNOSIS — Z8701 Personal history of pneumonia (recurrent): Secondary | ICD-10-CM | POA: Diagnosis not present

## 2018-10-23 DIAGNOSIS — G4736 Sleep related hypoventilation in conditions classified elsewhere: Secondary | ICD-10-CM | POA: Diagnosis not present

## 2018-10-23 DIAGNOSIS — R471 Dysarthria and anarthria: Secondary | ICD-10-CM | POA: Diagnosis not present

## 2018-10-23 DIAGNOSIS — R1310 Dysphagia, unspecified: Secondary | ICD-10-CM | POA: Diagnosis not present

## 2018-11-11 DIAGNOSIS — G1221 Amyotrophic lateral sclerosis: Secondary | ICD-10-CM | POA: Diagnosis not present

## 2018-11-11 DIAGNOSIS — R1310 Dysphagia, unspecified: Secondary | ICD-10-CM | POA: Diagnosis not present

## 2018-11-11 DIAGNOSIS — R634 Abnormal weight loss: Secondary | ICD-10-CM | POA: Diagnosis not present

## 2018-11-11 DIAGNOSIS — R64 Cachexia: Secondary | ICD-10-CM | POA: Diagnosis not present

## 2018-11-11 DIAGNOSIS — G4736 Sleep related hypoventilation in conditions classified elsewhere: Secondary | ICD-10-CM | POA: Diagnosis not present

## 2018-11-11 DIAGNOSIS — Z9989 Dependence on other enabling machines and devices: Secondary | ICD-10-CM | POA: Diagnosis not present

## 2018-11-11 DIAGNOSIS — E0590 Thyrotoxicosis, unspecified without thyrotoxic crisis or storm: Secondary | ICD-10-CM | POA: Diagnosis not present

## 2018-11-11 DIAGNOSIS — R471 Dysarthria and anarthria: Secondary | ICD-10-CM | POA: Diagnosis not present

## 2018-11-11 DIAGNOSIS — Z8701 Personal history of pneumonia (recurrent): Secondary | ICD-10-CM | POA: Diagnosis not present

## 2018-11-12 DIAGNOSIS — R634 Abnormal weight loss: Secondary | ICD-10-CM | POA: Diagnosis not present

## 2018-11-12 DIAGNOSIS — R1310 Dysphagia, unspecified: Secondary | ICD-10-CM | POA: Diagnosis not present

## 2018-11-12 DIAGNOSIS — R471 Dysarthria and anarthria: Secondary | ICD-10-CM | POA: Diagnosis not present

## 2018-11-12 DIAGNOSIS — Z8701 Personal history of pneumonia (recurrent): Secondary | ICD-10-CM | POA: Diagnosis not present

## 2018-11-12 DIAGNOSIS — G4736 Sleep related hypoventilation in conditions classified elsewhere: Secondary | ICD-10-CM | POA: Diagnosis not present

## 2018-11-12 DIAGNOSIS — G1221 Amyotrophic lateral sclerosis: Secondary | ICD-10-CM | POA: Diagnosis not present

## 2018-11-19 DIAGNOSIS — Z8701 Personal history of pneumonia (recurrent): Secondary | ICD-10-CM | POA: Diagnosis not present

## 2018-11-19 DIAGNOSIS — G4736 Sleep related hypoventilation in conditions classified elsewhere: Secondary | ICD-10-CM | POA: Diagnosis not present

## 2018-11-19 DIAGNOSIS — G1221 Amyotrophic lateral sclerosis: Secondary | ICD-10-CM | POA: Diagnosis not present

## 2018-11-19 DIAGNOSIS — R471 Dysarthria and anarthria: Secondary | ICD-10-CM | POA: Diagnosis not present

## 2018-11-19 DIAGNOSIS — R634 Abnormal weight loss: Secondary | ICD-10-CM | POA: Diagnosis not present

## 2018-11-19 DIAGNOSIS — R1310 Dysphagia, unspecified: Secondary | ICD-10-CM | POA: Diagnosis not present

## 2018-12-10 DIAGNOSIS — R1310 Dysphagia, unspecified: Secondary | ICD-10-CM | POA: Diagnosis not present

## 2018-12-10 DIAGNOSIS — R634 Abnormal weight loss: Secondary | ICD-10-CM | POA: Diagnosis not present

## 2018-12-10 DIAGNOSIS — G4736 Sleep related hypoventilation in conditions classified elsewhere: Secondary | ICD-10-CM | POA: Diagnosis not present

## 2018-12-10 DIAGNOSIS — Z8701 Personal history of pneumonia (recurrent): Secondary | ICD-10-CM | POA: Diagnosis not present

## 2018-12-10 DIAGNOSIS — R471 Dysarthria and anarthria: Secondary | ICD-10-CM | POA: Diagnosis not present

## 2018-12-10 DIAGNOSIS — G1221 Amyotrophic lateral sclerosis: Secondary | ICD-10-CM | POA: Diagnosis not present

## 2018-12-12 DIAGNOSIS — R471 Dysarthria and anarthria: Secondary | ICD-10-CM | POA: Diagnosis not present

## 2018-12-12 DIAGNOSIS — Z8701 Personal history of pneumonia (recurrent): Secondary | ICD-10-CM | POA: Diagnosis not present

## 2018-12-12 DIAGNOSIS — E0590 Thyrotoxicosis, unspecified without thyrotoxic crisis or storm: Secondary | ICD-10-CM | POA: Diagnosis not present

## 2018-12-12 DIAGNOSIS — R1310 Dysphagia, unspecified: Secondary | ICD-10-CM | POA: Diagnosis not present

## 2018-12-12 DIAGNOSIS — R634 Abnormal weight loss: Secondary | ICD-10-CM | POA: Diagnosis not present

## 2018-12-12 DIAGNOSIS — G1221 Amyotrophic lateral sclerosis: Secondary | ICD-10-CM | POA: Diagnosis not present

## 2018-12-12 DIAGNOSIS — G4736 Sleep related hypoventilation in conditions classified elsewhere: Secondary | ICD-10-CM | POA: Diagnosis not present

## 2018-12-12 DIAGNOSIS — Z9989 Dependence on other enabling machines and devices: Secondary | ICD-10-CM | POA: Diagnosis not present

## 2018-12-12 DIAGNOSIS — R64 Cachexia: Secondary | ICD-10-CM | POA: Diagnosis not present

## 2018-12-24 DIAGNOSIS — G1221 Amyotrophic lateral sclerosis: Secondary | ICD-10-CM | POA: Diagnosis not present

## 2018-12-24 DIAGNOSIS — R634 Abnormal weight loss: Secondary | ICD-10-CM | POA: Diagnosis not present

## 2018-12-24 DIAGNOSIS — R1310 Dysphagia, unspecified: Secondary | ICD-10-CM | POA: Diagnosis not present

## 2018-12-24 DIAGNOSIS — R471 Dysarthria and anarthria: Secondary | ICD-10-CM | POA: Diagnosis not present

## 2018-12-24 DIAGNOSIS — Z8701 Personal history of pneumonia (recurrent): Secondary | ICD-10-CM | POA: Diagnosis not present

## 2018-12-24 DIAGNOSIS — G4736 Sleep related hypoventilation in conditions classified elsewhere: Secondary | ICD-10-CM | POA: Diagnosis not present

## 2018-12-31 DIAGNOSIS — Z8701 Personal history of pneumonia (recurrent): Secondary | ICD-10-CM | POA: Diagnosis not present

## 2018-12-31 DIAGNOSIS — G1221 Amyotrophic lateral sclerosis: Secondary | ICD-10-CM | POA: Diagnosis not present

## 2018-12-31 DIAGNOSIS — R1310 Dysphagia, unspecified: Secondary | ICD-10-CM | POA: Diagnosis not present

## 2018-12-31 DIAGNOSIS — G4736 Sleep related hypoventilation in conditions classified elsewhere: Secondary | ICD-10-CM | POA: Diagnosis not present

## 2018-12-31 DIAGNOSIS — R471 Dysarthria and anarthria: Secondary | ICD-10-CM | POA: Diagnosis not present

## 2018-12-31 DIAGNOSIS — R634 Abnormal weight loss: Secondary | ICD-10-CM | POA: Diagnosis not present

## 2019-01-07 DIAGNOSIS — R634 Abnormal weight loss: Secondary | ICD-10-CM | POA: Diagnosis not present

## 2019-01-07 DIAGNOSIS — R471 Dysarthria and anarthria: Secondary | ICD-10-CM | POA: Diagnosis not present

## 2019-01-07 DIAGNOSIS — R1310 Dysphagia, unspecified: Secondary | ICD-10-CM | POA: Diagnosis not present

## 2019-01-07 DIAGNOSIS — Z8701 Personal history of pneumonia (recurrent): Secondary | ICD-10-CM | POA: Diagnosis not present

## 2019-01-07 DIAGNOSIS — G1221 Amyotrophic lateral sclerosis: Secondary | ICD-10-CM | POA: Diagnosis not present

## 2019-01-07 DIAGNOSIS — G4736 Sleep related hypoventilation in conditions classified elsewhere: Secondary | ICD-10-CM | POA: Diagnosis not present

## 2019-01-12 DIAGNOSIS — R471 Dysarthria and anarthria: Secondary | ICD-10-CM | POA: Diagnosis not present

## 2019-01-12 DIAGNOSIS — E0590 Thyrotoxicosis, unspecified without thyrotoxic crisis or storm: Secondary | ICD-10-CM | POA: Diagnosis not present

## 2019-01-12 DIAGNOSIS — R634 Abnormal weight loss: Secondary | ICD-10-CM | POA: Diagnosis not present

## 2019-01-12 DIAGNOSIS — R64 Cachexia: Secondary | ICD-10-CM | POA: Diagnosis not present

## 2019-01-12 DIAGNOSIS — G4736 Sleep related hypoventilation in conditions classified elsewhere: Secondary | ICD-10-CM | POA: Diagnosis not present

## 2019-01-12 DIAGNOSIS — Z9989 Dependence on other enabling machines and devices: Secondary | ICD-10-CM | POA: Diagnosis not present

## 2019-01-12 DIAGNOSIS — G1221 Amyotrophic lateral sclerosis: Secondary | ICD-10-CM | POA: Diagnosis not present

## 2019-01-12 DIAGNOSIS — Z8701 Personal history of pneumonia (recurrent): Secondary | ICD-10-CM | POA: Diagnosis not present

## 2019-01-12 DIAGNOSIS — R1310 Dysphagia, unspecified: Secondary | ICD-10-CM | POA: Diagnosis not present

## 2019-01-21 DIAGNOSIS — R634 Abnormal weight loss: Secondary | ICD-10-CM | POA: Diagnosis not present

## 2019-01-21 DIAGNOSIS — Z8701 Personal history of pneumonia (recurrent): Secondary | ICD-10-CM | POA: Diagnosis not present

## 2019-01-21 DIAGNOSIS — R1310 Dysphagia, unspecified: Secondary | ICD-10-CM | POA: Diagnosis not present

## 2019-01-21 DIAGNOSIS — R471 Dysarthria and anarthria: Secondary | ICD-10-CM | POA: Diagnosis not present

## 2019-01-21 DIAGNOSIS — G4736 Sleep related hypoventilation in conditions classified elsewhere: Secondary | ICD-10-CM | POA: Diagnosis not present

## 2019-01-21 DIAGNOSIS — G1221 Amyotrophic lateral sclerosis: Secondary | ICD-10-CM | POA: Diagnosis not present

## 2019-02-04 DIAGNOSIS — R1310 Dysphagia, unspecified: Secondary | ICD-10-CM | POA: Diagnosis not present

## 2019-02-04 DIAGNOSIS — G4736 Sleep related hypoventilation in conditions classified elsewhere: Secondary | ICD-10-CM | POA: Diagnosis not present

## 2019-02-04 DIAGNOSIS — Z8701 Personal history of pneumonia (recurrent): Secondary | ICD-10-CM | POA: Diagnosis not present

## 2019-02-04 DIAGNOSIS — R634 Abnormal weight loss: Secondary | ICD-10-CM | POA: Diagnosis not present

## 2019-02-04 DIAGNOSIS — G1221 Amyotrophic lateral sclerosis: Secondary | ICD-10-CM | POA: Diagnosis not present

## 2019-02-04 DIAGNOSIS — R471 Dysarthria and anarthria: Secondary | ICD-10-CM | POA: Diagnosis not present

## 2019-02-11 DIAGNOSIS — G4736 Sleep related hypoventilation in conditions classified elsewhere: Secondary | ICD-10-CM | POA: Diagnosis not present

## 2019-02-11 DIAGNOSIS — R634 Abnormal weight loss: Secondary | ICD-10-CM | POA: Diagnosis not present

## 2019-02-11 DIAGNOSIS — E0590 Thyrotoxicosis, unspecified without thyrotoxic crisis or storm: Secondary | ICD-10-CM | POA: Diagnosis not present

## 2019-02-11 DIAGNOSIS — R471 Dysarthria and anarthria: Secondary | ICD-10-CM | POA: Diagnosis not present

## 2019-02-11 DIAGNOSIS — G1221 Amyotrophic lateral sclerosis: Secondary | ICD-10-CM | POA: Diagnosis not present

## 2019-02-11 DIAGNOSIS — Z9989 Dependence on other enabling machines and devices: Secondary | ICD-10-CM | POA: Diagnosis not present

## 2019-02-11 DIAGNOSIS — R1310 Dysphagia, unspecified: Secondary | ICD-10-CM | POA: Diagnosis not present

## 2019-02-11 DIAGNOSIS — R64 Cachexia: Secondary | ICD-10-CM | POA: Diagnosis not present

## 2019-02-11 DIAGNOSIS — Z8701 Personal history of pneumonia (recurrent): Secondary | ICD-10-CM | POA: Diagnosis not present

## 2019-02-26 DIAGNOSIS — R1310 Dysphagia, unspecified: Secondary | ICD-10-CM | POA: Diagnosis not present

## 2019-02-26 DIAGNOSIS — R634 Abnormal weight loss: Secondary | ICD-10-CM | POA: Diagnosis not present

## 2019-02-26 DIAGNOSIS — Z8701 Personal history of pneumonia (recurrent): Secondary | ICD-10-CM | POA: Diagnosis not present

## 2019-02-26 DIAGNOSIS — G1221 Amyotrophic lateral sclerosis: Secondary | ICD-10-CM | POA: Diagnosis not present

## 2019-02-26 DIAGNOSIS — R471 Dysarthria and anarthria: Secondary | ICD-10-CM | POA: Diagnosis not present

## 2019-02-26 DIAGNOSIS — G4736 Sleep related hypoventilation in conditions classified elsewhere: Secondary | ICD-10-CM | POA: Diagnosis not present

## 2019-03-04 DIAGNOSIS — R471 Dysarthria and anarthria: Secondary | ICD-10-CM | POA: Diagnosis not present

## 2019-03-04 DIAGNOSIS — R1310 Dysphagia, unspecified: Secondary | ICD-10-CM | POA: Diagnosis not present

## 2019-03-04 DIAGNOSIS — G1221 Amyotrophic lateral sclerosis: Secondary | ICD-10-CM | POA: Diagnosis not present

## 2019-03-04 DIAGNOSIS — R634 Abnormal weight loss: Secondary | ICD-10-CM | POA: Diagnosis not present

## 2019-03-04 DIAGNOSIS — Z8701 Personal history of pneumonia (recurrent): Secondary | ICD-10-CM | POA: Diagnosis not present

## 2019-03-04 DIAGNOSIS — G4736 Sleep related hypoventilation in conditions classified elsewhere: Secondary | ICD-10-CM | POA: Diagnosis not present

## 2019-03-12 DIAGNOSIS — Z8701 Personal history of pneumonia (recurrent): Secondary | ICD-10-CM | POA: Diagnosis not present

## 2019-03-12 DIAGNOSIS — G1221 Amyotrophic lateral sclerosis: Secondary | ICD-10-CM | POA: Diagnosis not present

## 2019-03-12 DIAGNOSIS — R1310 Dysphagia, unspecified: Secondary | ICD-10-CM | POA: Diagnosis not present

## 2019-03-12 DIAGNOSIS — R471 Dysarthria and anarthria: Secondary | ICD-10-CM | POA: Diagnosis not present

## 2019-03-12 DIAGNOSIS — G4736 Sleep related hypoventilation in conditions classified elsewhere: Secondary | ICD-10-CM | POA: Diagnosis not present

## 2019-03-12 DIAGNOSIS — R634 Abnormal weight loss: Secondary | ICD-10-CM | POA: Diagnosis not present

## 2019-03-13 IMAGING — DX DG CHEST 1V PORT
1 series · 1 of 1 positions shown · non-contrast
Comparison: 12/30/2017

CLINICAL DATA: Shortness of breath, on BiPAP

EXAM:
PORTABLE CHEST 1 VIEW

[chest]
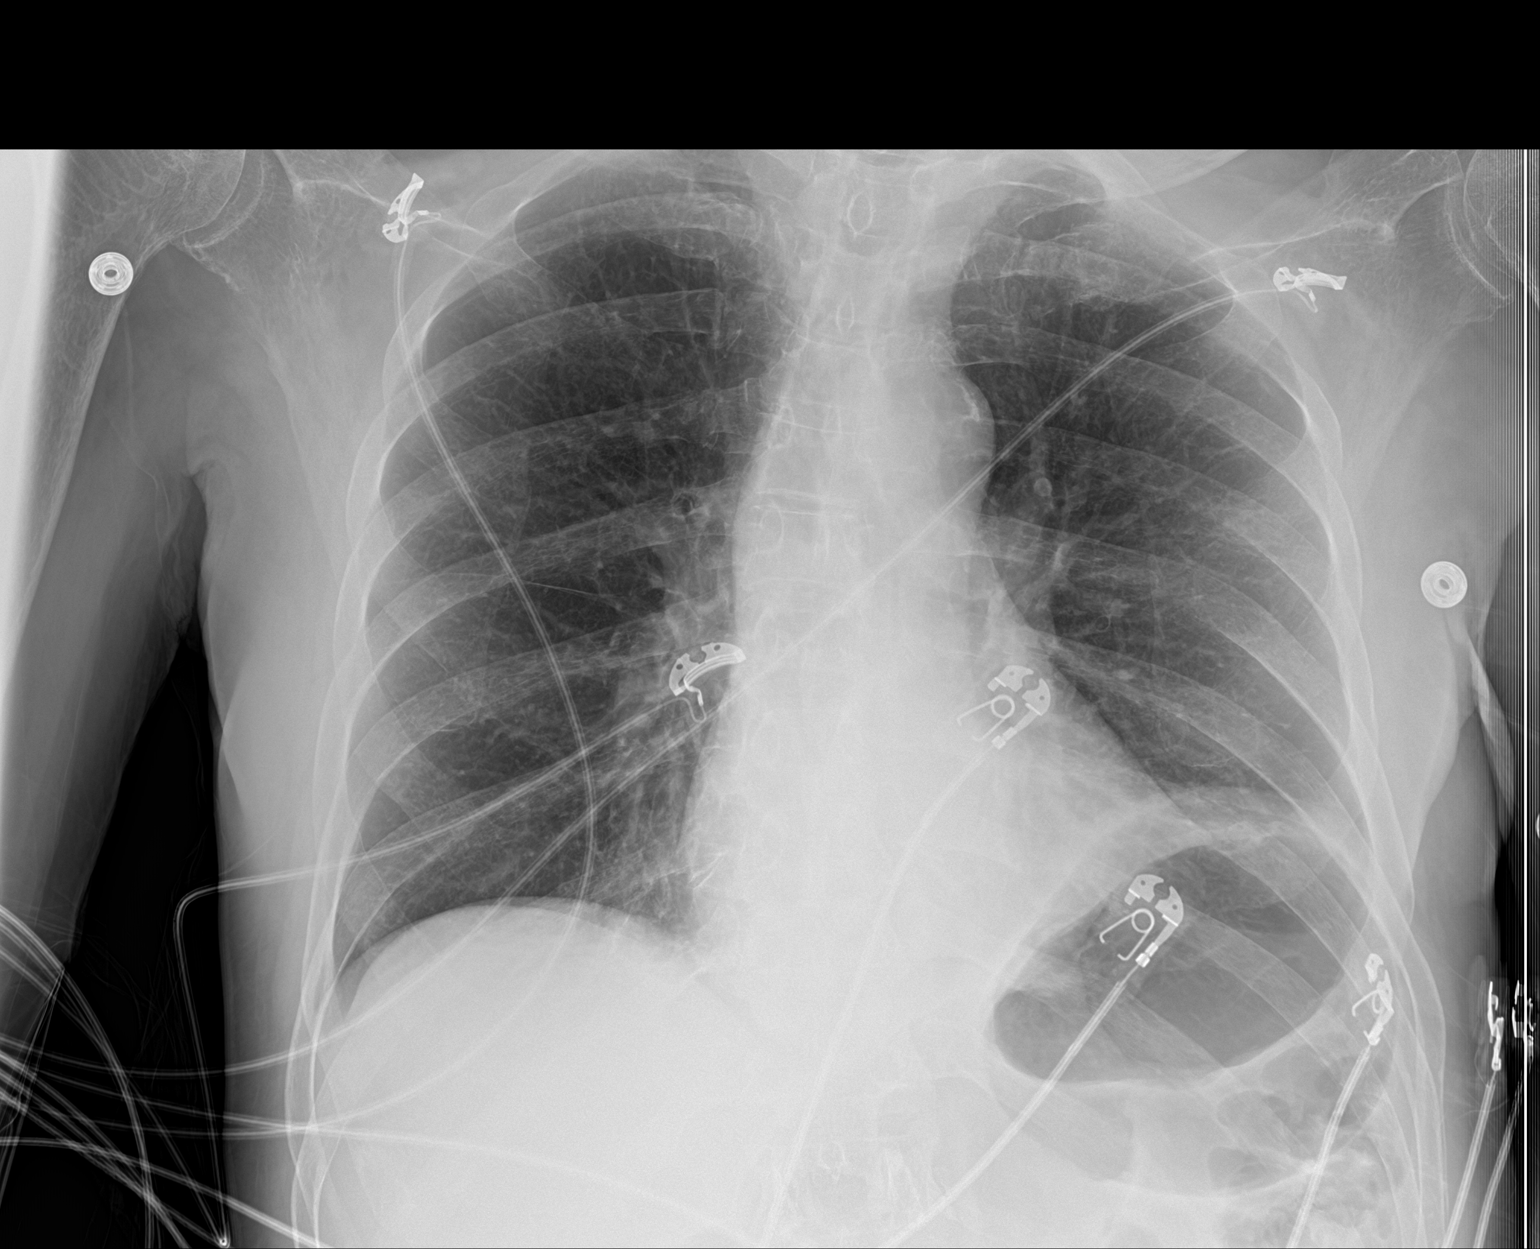

[1 of 1 positions shown; findings below may reference images not displayed]

FINDINGS: Mild eventration of the left hemidiaphragm. Right lung is
essentially clear. No pleural effusion or pneumothorax.

The heart is normal in size.
IMPRESSION: No evidence of acute cardiopulmonary disease.

## 2019-03-14 DIAGNOSIS — R471 Dysarthria and anarthria: Secondary | ICD-10-CM | POA: Diagnosis not present

## 2019-03-14 DIAGNOSIS — R1310 Dysphagia, unspecified: Secondary | ICD-10-CM | POA: Diagnosis not present

## 2019-03-14 DIAGNOSIS — R634 Abnormal weight loss: Secondary | ICD-10-CM | POA: Diagnosis not present

## 2019-03-14 DIAGNOSIS — Z9989 Dependence on other enabling machines and devices: Secondary | ICD-10-CM | POA: Diagnosis not present

## 2019-03-14 DIAGNOSIS — R64 Cachexia: Secondary | ICD-10-CM | POA: Diagnosis not present

## 2019-03-14 DIAGNOSIS — G1221 Amyotrophic lateral sclerosis: Secondary | ICD-10-CM | POA: Diagnosis not present

## 2019-03-14 DIAGNOSIS — G4736 Sleep related hypoventilation in conditions classified elsewhere: Secondary | ICD-10-CM | POA: Diagnosis not present

## 2019-03-14 DIAGNOSIS — E0590 Thyrotoxicosis, unspecified without thyrotoxic crisis or storm: Secondary | ICD-10-CM | POA: Diagnosis not present

## 2019-03-14 DIAGNOSIS — Z8701 Personal history of pneumonia (recurrent): Secondary | ICD-10-CM | POA: Diagnosis not present

## 2019-03-25 DIAGNOSIS — R471 Dysarthria and anarthria: Secondary | ICD-10-CM | POA: Diagnosis not present

## 2019-03-25 DIAGNOSIS — R634 Abnormal weight loss: Secondary | ICD-10-CM | POA: Diagnosis not present

## 2019-03-25 DIAGNOSIS — R1310 Dysphagia, unspecified: Secondary | ICD-10-CM | POA: Diagnosis not present

## 2019-03-25 DIAGNOSIS — G4736 Sleep related hypoventilation in conditions classified elsewhere: Secondary | ICD-10-CM | POA: Diagnosis not present

## 2019-03-25 DIAGNOSIS — G1221 Amyotrophic lateral sclerosis: Secondary | ICD-10-CM | POA: Diagnosis not present

## 2019-03-25 DIAGNOSIS — Z8701 Personal history of pneumonia (recurrent): Secondary | ICD-10-CM | POA: Diagnosis not present

## 2019-03-26 DIAGNOSIS — R1310 Dysphagia, unspecified: Secondary | ICD-10-CM | POA: Diagnosis not present

## 2019-03-26 DIAGNOSIS — Z8701 Personal history of pneumonia (recurrent): Secondary | ICD-10-CM | POA: Diagnosis not present

## 2019-03-26 DIAGNOSIS — R471 Dysarthria and anarthria: Secondary | ICD-10-CM | POA: Diagnosis not present

## 2019-03-26 DIAGNOSIS — R634 Abnormal weight loss: Secondary | ICD-10-CM | POA: Diagnosis not present

## 2019-03-26 DIAGNOSIS — G1221 Amyotrophic lateral sclerosis: Secondary | ICD-10-CM | POA: Diagnosis not present

## 2019-03-26 DIAGNOSIS — G4736 Sleep related hypoventilation in conditions classified elsewhere: Secondary | ICD-10-CM | POA: Diagnosis not present

## 2019-04-01 DIAGNOSIS — Z8701 Personal history of pneumonia (recurrent): Secondary | ICD-10-CM | POA: Diagnosis not present

## 2019-04-01 DIAGNOSIS — R634 Abnormal weight loss: Secondary | ICD-10-CM | POA: Diagnosis not present

## 2019-04-01 DIAGNOSIS — R471 Dysarthria and anarthria: Secondary | ICD-10-CM | POA: Diagnosis not present

## 2019-04-01 DIAGNOSIS — R1310 Dysphagia, unspecified: Secondary | ICD-10-CM | POA: Diagnosis not present

## 2019-04-01 DIAGNOSIS — G1221 Amyotrophic lateral sclerosis: Secondary | ICD-10-CM | POA: Diagnosis not present

## 2019-04-01 DIAGNOSIS — G4736 Sleep related hypoventilation in conditions classified elsewhere: Secondary | ICD-10-CM | POA: Diagnosis not present

## 2019-04-07 DIAGNOSIS — R1310 Dysphagia, unspecified: Secondary | ICD-10-CM | POA: Diagnosis not present

## 2019-04-07 DIAGNOSIS — R634 Abnormal weight loss: Secondary | ICD-10-CM | POA: Diagnosis not present

## 2019-04-07 DIAGNOSIS — G1221 Amyotrophic lateral sclerosis: Secondary | ICD-10-CM | POA: Diagnosis not present

## 2019-04-07 DIAGNOSIS — Z8701 Personal history of pneumonia (recurrent): Secondary | ICD-10-CM | POA: Diagnosis not present

## 2019-04-07 DIAGNOSIS — R471 Dysarthria and anarthria: Secondary | ICD-10-CM | POA: Diagnosis not present

## 2019-04-07 DIAGNOSIS — G4736 Sleep related hypoventilation in conditions classified elsewhere: Secondary | ICD-10-CM | POA: Diagnosis not present

## 2019-05-14 DIAGNOSIS — R1310 Dysphagia, unspecified: Secondary | ICD-10-CM | POA: Diagnosis not present

## 2019-05-14 DIAGNOSIS — E0590 Thyrotoxicosis, unspecified without thyrotoxic crisis or storm: Secondary | ICD-10-CM | POA: Diagnosis not present

## 2019-05-14 DIAGNOSIS — G4736 Sleep related hypoventilation in conditions classified elsewhere: Secondary | ICD-10-CM | POA: Diagnosis not present

## 2019-05-14 DIAGNOSIS — G1221 Amyotrophic lateral sclerosis: Secondary | ICD-10-CM | POA: Diagnosis not present

## 2019-05-14 DIAGNOSIS — Z9989 Dependence on other enabling machines and devices: Secondary | ICD-10-CM | POA: Diagnosis not present

## 2019-05-14 DIAGNOSIS — R64 Cachexia: Secondary | ICD-10-CM | POA: Diagnosis not present

## 2019-05-14 DIAGNOSIS — R471 Dysarthria and anarthria: Secondary | ICD-10-CM | POA: Diagnosis not present

## 2019-05-14 DIAGNOSIS — Z8701 Personal history of pneumonia (recurrent): Secondary | ICD-10-CM | POA: Diagnosis not present

## 2019-05-14 DIAGNOSIS — R634 Abnormal weight loss: Secondary | ICD-10-CM | POA: Diagnosis not present

## 2019-05-20 DIAGNOSIS — R634 Abnormal weight loss: Secondary | ICD-10-CM | POA: Diagnosis not present

## 2019-05-20 DIAGNOSIS — R1310 Dysphagia, unspecified: Secondary | ICD-10-CM | POA: Diagnosis not present

## 2019-05-20 DIAGNOSIS — Z8701 Personal history of pneumonia (recurrent): Secondary | ICD-10-CM | POA: Diagnosis not present

## 2019-05-20 DIAGNOSIS — G1221 Amyotrophic lateral sclerosis: Secondary | ICD-10-CM | POA: Diagnosis not present

## 2019-05-20 DIAGNOSIS — R471 Dysarthria and anarthria: Secondary | ICD-10-CM | POA: Diagnosis not present

## 2019-05-20 DIAGNOSIS — G4736 Sleep related hypoventilation in conditions classified elsewhere: Secondary | ICD-10-CM | POA: Diagnosis not present

## 2019-05-27 DIAGNOSIS — Z8701 Personal history of pneumonia (recurrent): Secondary | ICD-10-CM | POA: Diagnosis not present

## 2019-05-27 DIAGNOSIS — G4736 Sleep related hypoventilation in conditions classified elsewhere: Secondary | ICD-10-CM | POA: Diagnosis not present

## 2019-05-27 DIAGNOSIS — G1221 Amyotrophic lateral sclerosis: Secondary | ICD-10-CM | POA: Diagnosis not present

## 2019-05-27 DIAGNOSIS — R634 Abnormal weight loss: Secondary | ICD-10-CM | POA: Diagnosis not present

## 2019-05-27 DIAGNOSIS — R1310 Dysphagia, unspecified: Secondary | ICD-10-CM | POA: Diagnosis not present

## 2019-05-27 DIAGNOSIS — R471 Dysarthria and anarthria: Secondary | ICD-10-CM | POA: Diagnosis not present

## 2019-06-03 DIAGNOSIS — R1310 Dysphagia, unspecified: Secondary | ICD-10-CM | POA: Diagnosis not present

## 2019-06-03 DIAGNOSIS — Z8701 Personal history of pneumonia (recurrent): Secondary | ICD-10-CM | POA: Diagnosis not present

## 2019-06-03 DIAGNOSIS — R471 Dysarthria and anarthria: Secondary | ICD-10-CM | POA: Diagnosis not present

## 2019-06-03 DIAGNOSIS — G4736 Sleep related hypoventilation in conditions classified elsewhere: Secondary | ICD-10-CM | POA: Diagnosis not present

## 2019-06-03 DIAGNOSIS — R634 Abnormal weight loss: Secondary | ICD-10-CM | POA: Diagnosis not present

## 2019-06-03 DIAGNOSIS — G1221 Amyotrophic lateral sclerosis: Secondary | ICD-10-CM | POA: Diagnosis not present

## 2019-06-07 DIAGNOSIS — R471 Dysarthria and anarthria: Secondary | ICD-10-CM | POA: Diagnosis not present

## 2019-06-07 DIAGNOSIS — R634 Abnormal weight loss: Secondary | ICD-10-CM | POA: Diagnosis not present

## 2019-06-07 DIAGNOSIS — G4736 Sleep related hypoventilation in conditions classified elsewhere: Secondary | ICD-10-CM | POA: Diagnosis not present

## 2019-06-07 DIAGNOSIS — Z8701 Personal history of pneumonia (recurrent): Secondary | ICD-10-CM | POA: Diagnosis not present

## 2019-06-07 DIAGNOSIS — G1221 Amyotrophic lateral sclerosis: Secondary | ICD-10-CM | POA: Diagnosis not present

## 2019-06-07 DIAGNOSIS — R1310 Dysphagia, unspecified: Secondary | ICD-10-CM | POA: Diagnosis not present

## 2019-06-09 DIAGNOSIS — G4736 Sleep related hypoventilation in conditions classified elsewhere: Secondary | ICD-10-CM | POA: Diagnosis not present

## 2019-06-09 DIAGNOSIS — R471 Dysarthria and anarthria: Secondary | ICD-10-CM | POA: Diagnosis not present

## 2019-06-09 DIAGNOSIS — Z8701 Personal history of pneumonia (recurrent): Secondary | ICD-10-CM | POA: Diagnosis not present

## 2019-06-09 DIAGNOSIS — R1310 Dysphagia, unspecified: Secondary | ICD-10-CM | POA: Diagnosis not present

## 2019-06-09 DIAGNOSIS — R634 Abnormal weight loss: Secondary | ICD-10-CM | POA: Diagnosis not present

## 2019-06-09 DIAGNOSIS — G1221 Amyotrophic lateral sclerosis: Secondary | ICD-10-CM | POA: Diagnosis not present

## 2019-06-14 DIAGNOSIS — R471 Dysarthria and anarthria: Secondary | ICD-10-CM | POA: Diagnosis not present

## 2019-06-14 DIAGNOSIS — G4736 Sleep related hypoventilation in conditions classified elsewhere: Secondary | ICD-10-CM | POA: Diagnosis not present

## 2019-06-14 DIAGNOSIS — G1221 Amyotrophic lateral sclerosis: Secondary | ICD-10-CM | POA: Diagnosis not present

## 2019-06-14 DIAGNOSIS — Z8701 Personal history of pneumonia (recurrent): Secondary | ICD-10-CM | POA: Diagnosis not present

## 2019-06-14 DIAGNOSIS — Z9989 Dependence on other enabling machines and devices: Secondary | ICD-10-CM | POA: Diagnosis not present

## 2019-06-14 DIAGNOSIS — E0590 Thyrotoxicosis, unspecified without thyrotoxic crisis or storm: Secondary | ICD-10-CM | POA: Diagnosis not present

## 2019-06-14 DIAGNOSIS — R64 Cachexia: Secondary | ICD-10-CM | POA: Diagnosis not present

## 2019-06-14 DIAGNOSIS — R634 Abnormal weight loss: Secondary | ICD-10-CM | POA: Diagnosis not present

## 2019-06-14 DIAGNOSIS — R1310 Dysphagia, unspecified: Secondary | ICD-10-CM | POA: Diagnosis not present

## 2019-06-17 DIAGNOSIS — R471 Dysarthria and anarthria: Secondary | ICD-10-CM | POA: Diagnosis not present

## 2019-06-17 DIAGNOSIS — Z8701 Personal history of pneumonia (recurrent): Secondary | ICD-10-CM | POA: Diagnosis not present

## 2019-06-17 DIAGNOSIS — R1310 Dysphagia, unspecified: Secondary | ICD-10-CM | POA: Diagnosis not present

## 2019-06-17 DIAGNOSIS — G4736 Sleep related hypoventilation in conditions classified elsewhere: Secondary | ICD-10-CM | POA: Diagnosis not present

## 2019-06-17 DIAGNOSIS — R634 Abnormal weight loss: Secondary | ICD-10-CM | POA: Diagnosis not present

## 2019-06-17 DIAGNOSIS — G1221 Amyotrophic lateral sclerosis: Secondary | ICD-10-CM | POA: Diagnosis not present

## 2019-06-24 DIAGNOSIS — R634 Abnormal weight loss: Secondary | ICD-10-CM | POA: Diagnosis not present

## 2019-06-24 DIAGNOSIS — R471 Dysarthria and anarthria: Secondary | ICD-10-CM | POA: Diagnosis not present

## 2019-06-24 DIAGNOSIS — G4736 Sleep related hypoventilation in conditions classified elsewhere: Secondary | ICD-10-CM | POA: Diagnosis not present

## 2019-06-24 DIAGNOSIS — Z8701 Personal history of pneumonia (recurrent): Secondary | ICD-10-CM | POA: Diagnosis not present

## 2019-06-24 DIAGNOSIS — R1310 Dysphagia, unspecified: Secondary | ICD-10-CM | POA: Diagnosis not present

## 2019-06-24 DIAGNOSIS — G1221 Amyotrophic lateral sclerosis: Secondary | ICD-10-CM | POA: Diagnosis not present

## 2019-06-30 DIAGNOSIS — R634 Abnormal weight loss: Secondary | ICD-10-CM | POA: Diagnosis not present

## 2019-06-30 DIAGNOSIS — G1221 Amyotrophic lateral sclerosis: Secondary | ICD-10-CM | POA: Diagnosis not present

## 2019-06-30 DIAGNOSIS — G4736 Sleep related hypoventilation in conditions classified elsewhere: Secondary | ICD-10-CM | POA: Diagnosis not present

## 2019-06-30 DIAGNOSIS — Z8701 Personal history of pneumonia (recurrent): Secondary | ICD-10-CM | POA: Diagnosis not present

## 2019-06-30 DIAGNOSIS — R471 Dysarthria and anarthria: Secondary | ICD-10-CM | POA: Diagnosis not present

## 2019-06-30 DIAGNOSIS — R1310 Dysphagia, unspecified: Secondary | ICD-10-CM | POA: Diagnosis not present

## 2019-07-08 DIAGNOSIS — R471 Dysarthria and anarthria: Secondary | ICD-10-CM | POA: Diagnosis not present

## 2019-07-08 DIAGNOSIS — R634 Abnormal weight loss: Secondary | ICD-10-CM | POA: Diagnosis not present

## 2019-07-08 DIAGNOSIS — Z8701 Personal history of pneumonia (recurrent): Secondary | ICD-10-CM | POA: Diagnosis not present

## 2019-07-08 DIAGNOSIS — G1221 Amyotrophic lateral sclerosis: Secondary | ICD-10-CM | POA: Diagnosis not present

## 2019-07-08 DIAGNOSIS — R1310 Dysphagia, unspecified: Secondary | ICD-10-CM | POA: Diagnosis not present

## 2019-07-08 DIAGNOSIS — G4736 Sleep related hypoventilation in conditions classified elsewhere: Secondary | ICD-10-CM | POA: Diagnosis not present

## 2019-07-11 DIAGNOSIS — R634 Abnormal weight loss: Secondary | ICD-10-CM | POA: Diagnosis not present

## 2019-07-11 DIAGNOSIS — R1310 Dysphagia, unspecified: Secondary | ICD-10-CM | POA: Diagnosis not present

## 2019-07-11 DIAGNOSIS — G1221 Amyotrophic lateral sclerosis: Secondary | ICD-10-CM | POA: Diagnosis not present

## 2019-07-11 DIAGNOSIS — Z8701 Personal history of pneumonia (recurrent): Secondary | ICD-10-CM | POA: Diagnosis not present

## 2019-07-11 DIAGNOSIS — G4736 Sleep related hypoventilation in conditions classified elsewhere: Secondary | ICD-10-CM | POA: Diagnosis not present

## 2019-07-11 DIAGNOSIS — R471 Dysarthria and anarthria: Secondary | ICD-10-CM | POA: Diagnosis not present

## 2019-07-12 DIAGNOSIS — E0590 Thyrotoxicosis, unspecified without thyrotoxic crisis or storm: Secondary | ICD-10-CM | POA: Diagnosis not present

## 2019-07-12 DIAGNOSIS — Z8701 Personal history of pneumonia (recurrent): Secondary | ICD-10-CM | POA: Diagnosis not present

## 2019-07-12 DIAGNOSIS — R64 Cachexia: Secondary | ICD-10-CM | POA: Diagnosis not present

## 2019-07-12 DIAGNOSIS — G1221 Amyotrophic lateral sclerosis: Secondary | ICD-10-CM | POA: Diagnosis not present

## 2019-07-12 DIAGNOSIS — R1310 Dysphagia, unspecified: Secondary | ICD-10-CM | POA: Diagnosis not present

## 2019-07-12 DIAGNOSIS — R471 Dysarthria and anarthria: Secondary | ICD-10-CM | POA: Diagnosis not present

## 2019-07-12 DIAGNOSIS — R634 Abnormal weight loss: Secondary | ICD-10-CM | POA: Diagnosis not present

## 2019-07-12 DIAGNOSIS — Z9989 Dependence on other enabling machines and devices: Secondary | ICD-10-CM | POA: Diagnosis not present

## 2019-07-12 DIAGNOSIS — G4736 Sleep related hypoventilation in conditions classified elsewhere: Secondary | ICD-10-CM | POA: Diagnosis not present

## 2019-07-15 DIAGNOSIS — Z8701 Personal history of pneumonia (recurrent): Secondary | ICD-10-CM | POA: Diagnosis not present

## 2019-07-15 DIAGNOSIS — R1310 Dysphagia, unspecified: Secondary | ICD-10-CM | POA: Diagnosis not present

## 2019-07-15 DIAGNOSIS — G4736 Sleep related hypoventilation in conditions classified elsewhere: Secondary | ICD-10-CM | POA: Diagnosis not present

## 2019-07-15 DIAGNOSIS — G1221 Amyotrophic lateral sclerosis: Secondary | ICD-10-CM | POA: Diagnosis not present

## 2019-07-15 DIAGNOSIS — R634 Abnormal weight loss: Secondary | ICD-10-CM | POA: Diagnosis not present

## 2019-07-15 DIAGNOSIS — R471 Dysarthria and anarthria: Secondary | ICD-10-CM | POA: Diagnosis not present

## 2019-07-21 DIAGNOSIS — G4736 Sleep related hypoventilation in conditions classified elsewhere: Secondary | ICD-10-CM | POA: Diagnosis not present

## 2019-07-21 DIAGNOSIS — G1221 Amyotrophic lateral sclerosis: Secondary | ICD-10-CM | POA: Diagnosis not present

## 2019-07-21 DIAGNOSIS — Z8701 Personal history of pneumonia (recurrent): Secondary | ICD-10-CM | POA: Diagnosis not present

## 2019-07-21 DIAGNOSIS — R471 Dysarthria and anarthria: Secondary | ICD-10-CM | POA: Diagnosis not present

## 2019-07-21 DIAGNOSIS — R1310 Dysphagia, unspecified: Secondary | ICD-10-CM | POA: Diagnosis not present

## 2019-07-21 DIAGNOSIS — R634 Abnormal weight loss: Secondary | ICD-10-CM | POA: Diagnosis not present

## 2019-07-22 DIAGNOSIS — R1310 Dysphagia, unspecified: Secondary | ICD-10-CM | POA: Diagnosis not present

## 2019-07-22 DIAGNOSIS — G4736 Sleep related hypoventilation in conditions classified elsewhere: Secondary | ICD-10-CM | POA: Diagnosis not present

## 2019-07-22 DIAGNOSIS — Z8701 Personal history of pneumonia (recurrent): Secondary | ICD-10-CM | POA: Diagnosis not present

## 2019-07-22 DIAGNOSIS — G1221 Amyotrophic lateral sclerosis: Secondary | ICD-10-CM | POA: Diagnosis not present

## 2019-07-22 DIAGNOSIS — R471 Dysarthria and anarthria: Secondary | ICD-10-CM | POA: Diagnosis not present

## 2019-07-22 DIAGNOSIS — R634 Abnormal weight loss: Secondary | ICD-10-CM | POA: Diagnosis not present

## 2019-07-26 DIAGNOSIS — Z8701 Personal history of pneumonia (recurrent): Secondary | ICD-10-CM | POA: Diagnosis not present

## 2019-07-26 DIAGNOSIS — R471 Dysarthria and anarthria: Secondary | ICD-10-CM | POA: Diagnosis not present

## 2019-07-26 DIAGNOSIS — G1221 Amyotrophic lateral sclerosis: Secondary | ICD-10-CM | POA: Diagnosis not present

## 2019-07-26 DIAGNOSIS — R634 Abnormal weight loss: Secondary | ICD-10-CM | POA: Diagnosis not present

## 2019-07-26 DIAGNOSIS — G4736 Sleep related hypoventilation in conditions classified elsewhere: Secondary | ICD-10-CM | POA: Diagnosis not present

## 2019-07-26 DIAGNOSIS — R1310 Dysphagia, unspecified: Secondary | ICD-10-CM | POA: Diagnosis not present

## 2019-07-29 DIAGNOSIS — G4736 Sleep related hypoventilation in conditions classified elsewhere: Secondary | ICD-10-CM | POA: Diagnosis not present

## 2019-07-29 DIAGNOSIS — Z8701 Personal history of pneumonia (recurrent): Secondary | ICD-10-CM | POA: Diagnosis not present

## 2019-07-29 DIAGNOSIS — R1310 Dysphagia, unspecified: Secondary | ICD-10-CM | POA: Diagnosis not present

## 2019-07-29 DIAGNOSIS — G1221 Amyotrophic lateral sclerosis: Secondary | ICD-10-CM | POA: Diagnosis not present

## 2019-07-29 DIAGNOSIS — R634 Abnormal weight loss: Secondary | ICD-10-CM | POA: Diagnosis not present

## 2019-07-29 DIAGNOSIS — R471 Dysarthria and anarthria: Secondary | ICD-10-CM | POA: Diagnosis not present

## 2019-08-02 DIAGNOSIS — G1221 Amyotrophic lateral sclerosis: Secondary | ICD-10-CM | POA: Diagnosis not present

## 2019-08-02 DIAGNOSIS — R634 Abnormal weight loss: Secondary | ICD-10-CM | POA: Diagnosis not present

## 2019-08-02 DIAGNOSIS — Z8701 Personal history of pneumonia (recurrent): Secondary | ICD-10-CM | POA: Diagnosis not present

## 2019-08-02 DIAGNOSIS — R1310 Dysphagia, unspecified: Secondary | ICD-10-CM | POA: Diagnosis not present

## 2019-08-02 DIAGNOSIS — R471 Dysarthria and anarthria: Secondary | ICD-10-CM | POA: Diagnosis not present

## 2019-08-02 DIAGNOSIS — G4736 Sleep related hypoventilation in conditions classified elsewhere: Secondary | ICD-10-CM | POA: Diagnosis not present

## 2019-08-04 DIAGNOSIS — R471 Dysarthria and anarthria: Secondary | ICD-10-CM | POA: Diagnosis not present

## 2019-08-04 DIAGNOSIS — R1310 Dysphagia, unspecified: Secondary | ICD-10-CM | POA: Diagnosis not present

## 2019-08-04 DIAGNOSIS — Z8701 Personal history of pneumonia (recurrent): Secondary | ICD-10-CM | POA: Diagnosis not present

## 2019-08-04 DIAGNOSIS — G1221 Amyotrophic lateral sclerosis: Secondary | ICD-10-CM | POA: Diagnosis not present

## 2019-08-04 DIAGNOSIS — R634 Abnormal weight loss: Secondary | ICD-10-CM | POA: Diagnosis not present

## 2019-08-04 DIAGNOSIS — G4736 Sleep related hypoventilation in conditions classified elsewhere: Secondary | ICD-10-CM | POA: Diagnosis not present

## 2019-08-05 DIAGNOSIS — G1221 Amyotrophic lateral sclerosis: Secondary | ICD-10-CM | POA: Diagnosis not present

## 2019-08-05 DIAGNOSIS — R471 Dysarthria and anarthria: Secondary | ICD-10-CM | POA: Diagnosis not present

## 2019-08-05 DIAGNOSIS — G4736 Sleep related hypoventilation in conditions classified elsewhere: Secondary | ICD-10-CM | POA: Diagnosis not present

## 2019-08-05 DIAGNOSIS — R1310 Dysphagia, unspecified: Secondary | ICD-10-CM | POA: Diagnosis not present

## 2019-08-05 DIAGNOSIS — R634 Abnormal weight loss: Secondary | ICD-10-CM | POA: Diagnosis not present

## 2019-08-05 DIAGNOSIS — Z8701 Personal history of pneumonia (recurrent): Secondary | ICD-10-CM | POA: Diagnosis not present

## 2019-08-09 DIAGNOSIS — R634 Abnormal weight loss: Secondary | ICD-10-CM | POA: Diagnosis not present

## 2019-08-09 DIAGNOSIS — G4736 Sleep related hypoventilation in conditions classified elsewhere: Secondary | ICD-10-CM | POA: Diagnosis not present

## 2019-08-09 DIAGNOSIS — R1310 Dysphagia, unspecified: Secondary | ICD-10-CM | POA: Diagnosis not present

## 2019-08-09 DIAGNOSIS — G1221 Amyotrophic lateral sclerosis: Secondary | ICD-10-CM | POA: Diagnosis not present

## 2019-08-09 DIAGNOSIS — Z8701 Personal history of pneumonia (recurrent): Secondary | ICD-10-CM | POA: Diagnosis not present

## 2019-08-09 DIAGNOSIS — R471 Dysarthria and anarthria: Secondary | ICD-10-CM | POA: Diagnosis not present

## 2019-08-12 DIAGNOSIS — R634 Abnormal weight loss: Secondary | ICD-10-CM | POA: Diagnosis not present

## 2019-08-12 DIAGNOSIS — G4736 Sleep related hypoventilation in conditions classified elsewhere: Secondary | ICD-10-CM | POA: Diagnosis not present

## 2019-08-12 DIAGNOSIS — R64 Cachexia: Secondary | ICD-10-CM | POA: Diagnosis not present

## 2019-08-12 DIAGNOSIS — Z8701 Personal history of pneumonia (recurrent): Secondary | ICD-10-CM | POA: Diagnosis not present

## 2019-08-12 DIAGNOSIS — R471 Dysarthria and anarthria: Secondary | ICD-10-CM | POA: Diagnosis not present

## 2019-08-12 DIAGNOSIS — G1221 Amyotrophic lateral sclerosis: Secondary | ICD-10-CM | POA: Diagnosis not present

## 2019-08-12 DIAGNOSIS — Z9989 Dependence on other enabling machines and devices: Secondary | ICD-10-CM | POA: Diagnosis not present

## 2019-08-12 DIAGNOSIS — E0590 Thyrotoxicosis, unspecified without thyrotoxic crisis or storm: Secondary | ICD-10-CM | POA: Diagnosis not present

## 2019-08-12 DIAGNOSIS — R1310 Dysphagia, unspecified: Secondary | ICD-10-CM | POA: Diagnosis not present

## 2019-08-13 DIAGNOSIS — G1221 Amyotrophic lateral sclerosis: Secondary | ICD-10-CM | POA: Diagnosis not present

## 2019-08-13 DIAGNOSIS — G4736 Sleep related hypoventilation in conditions classified elsewhere: Secondary | ICD-10-CM | POA: Diagnosis not present

## 2019-08-13 DIAGNOSIS — R471 Dysarthria and anarthria: Secondary | ICD-10-CM | POA: Diagnosis not present

## 2019-08-13 DIAGNOSIS — R634 Abnormal weight loss: Secondary | ICD-10-CM | POA: Diagnosis not present

## 2019-08-13 DIAGNOSIS — R1310 Dysphagia, unspecified: Secondary | ICD-10-CM | POA: Diagnosis not present

## 2019-08-13 DIAGNOSIS — Z8701 Personal history of pneumonia (recurrent): Secondary | ICD-10-CM | POA: Diagnosis not present

## 2019-08-16 DIAGNOSIS — R634 Abnormal weight loss: Secondary | ICD-10-CM | POA: Diagnosis not present

## 2019-08-16 DIAGNOSIS — R471 Dysarthria and anarthria: Secondary | ICD-10-CM | POA: Diagnosis not present

## 2019-08-16 DIAGNOSIS — G4736 Sleep related hypoventilation in conditions classified elsewhere: Secondary | ICD-10-CM | POA: Diagnosis not present

## 2019-08-16 DIAGNOSIS — Z8701 Personal history of pneumonia (recurrent): Secondary | ICD-10-CM | POA: Diagnosis not present

## 2019-08-16 DIAGNOSIS — R1310 Dysphagia, unspecified: Secondary | ICD-10-CM | POA: Diagnosis not present

## 2019-08-16 DIAGNOSIS — G1221 Amyotrophic lateral sclerosis: Secondary | ICD-10-CM | POA: Diagnosis not present

## 2019-08-20 DIAGNOSIS — R471 Dysarthria and anarthria: Secondary | ICD-10-CM | POA: Diagnosis not present

## 2019-08-20 DIAGNOSIS — G1221 Amyotrophic lateral sclerosis: Secondary | ICD-10-CM | POA: Diagnosis not present

## 2019-08-20 DIAGNOSIS — G4736 Sleep related hypoventilation in conditions classified elsewhere: Secondary | ICD-10-CM | POA: Diagnosis not present

## 2019-08-20 DIAGNOSIS — R634 Abnormal weight loss: Secondary | ICD-10-CM | POA: Diagnosis not present

## 2019-08-20 DIAGNOSIS — R1310 Dysphagia, unspecified: Secondary | ICD-10-CM | POA: Diagnosis not present

## 2019-08-20 DIAGNOSIS — Z8701 Personal history of pneumonia (recurrent): Secondary | ICD-10-CM | POA: Diagnosis not present

## 2019-08-23 DIAGNOSIS — G1221 Amyotrophic lateral sclerosis: Secondary | ICD-10-CM | POA: Diagnosis not present

## 2019-08-23 DIAGNOSIS — R471 Dysarthria and anarthria: Secondary | ICD-10-CM | POA: Diagnosis not present

## 2019-08-23 DIAGNOSIS — R634 Abnormal weight loss: Secondary | ICD-10-CM | POA: Diagnosis not present

## 2019-08-23 DIAGNOSIS — G4736 Sleep related hypoventilation in conditions classified elsewhere: Secondary | ICD-10-CM | POA: Diagnosis not present

## 2019-08-23 DIAGNOSIS — R1310 Dysphagia, unspecified: Secondary | ICD-10-CM | POA: Diagnosis not present

## 2019-08-23 DIAGNOSIS — Z8701 Personal history of pneumonia (recurrent): Secondary | ICD-10-CM | POA: Diagnosis not present

## 2019-08-25 DIAGNOSIS — G4736 Sleep related hypoventilation in conditions classified elsewhere: Secondary | ICD-10-CM | POA: Diagnosis not present

## 2019-08-25 DIAGNOSIS — Z8701 Personal history of pneumonia (recurrent): Secondary | ICD-10-CM | POA: Diagnosis not present

## 2019-08-25 DIAGNOSIS — R634 Abnormal weight loss: Secondary | ICD-10-CM | POA: Diagnosis not present

## 2019-08-25 DIAGNOSIS — G1221 Amyotrophic lateral sclerosis: Secondary | ICD-10-CM | POA: Diagnosis not present

## 2019-08-25 DIAGNOSIS — R1310 Dysphagia, unspecified: Secondary | ICD-10-CM | POA: Diagnosis not present

## 2019-08-25 DIAGNOSIS — R471 Dysarthria and anarthria: Secondary | ICD-10-CM | POA: Diagnosis not present

## 2019-08-27 DIAGNOSIS — R634 Abnormal weight loss: Secondary | ICD-10-CM | POA: Diagnosis not present

## 2019-08-27 DIAGNOSIS — R1310 Dysphagia, unspecified: Secondary | ICD-10-CM | POA: Diagnosis not present

## 2019-08-27 DIAGNOSIS — Z8701 Personal history of pneumonia (recurrent): Secondary | ICD-10-CM | POA: Diagnosis not present

## 2019-08-27 DIAGNOSIS — R471 Dysarthria and anarthria: Secondary | ICD-10-CM | POA: Diagnosis not present

## 2019-08-27 DIAGNOSIS — G4736 Sleep related hypoventilation in conditions classified elsewhere: Secondary | ICD-10-CM | POA: Diagnosis not present

## 2019-08-27 DIAGNOSIS — G1221 Amyotrophic lateral sclerosis: Secondary | ICD-10-CM | POA: Diagnosis not present

## 2019-09-01 DIAGNOSIS — R471 Dysarthria and anarthria: Secondary | ICD-10-CM | POA: Diagnosis not present

## 2019-09-01 DIAGNOSIS — R634 Abnormal weight loss: Secondary | ICD-10-CM | POA: Diagnosis not present

## 2019-09-01 DIAGNOSIS — G1221 Amyotrophic lateral sclerosis: Secondary | ICD-10-CM | POA: Diagnosis not present

## 2019-09-01 DIAGNOSIS — R1310 Dysphagia, unspecified: Secondary | ICD-10-CM | POA: Diagnosis not present

## 2019-09-01 DIAGNOSIS — Z8701 Personal history of pneumonia (recurrent): Secondary | ICD-10-CM | POA: Diagnosis not present

## 2019-09-01 DIAGNOSIS — G4736 Sleep related hypoventilation in conditions classified elsewhere: Secondary | ICD-10-CM | POA: Diagnosis not present

## 2019-09-02 DIAGNOSIS — R634 Abnormal weight loss: Secondary | ICD-10-CM | POA: Diagnosis not present

## 2019-09-02 DIAGNOSIS — R471 Dysarthria and anarthria: Secondary | ICD-10-CM | POA: Diagnosis not present

## 2019-09-02 DIAGNOSIS — R1310 Dysphagia, unspecified: Secondary | ICD-10-CM | POA: Diagnosis not present

## 2019-09-02 DIAGNOSIS — G1221 Amyotrophic lateral sclerosis: Secondary | ICD-10-CM | POA: Diagnosis not present

## 2019-09-02 DIAGNOSIS — Z8701 Personal history of pneumonia (recurrent): Secondary | ICD-10-CM | POA: Diagnosis not present

## 2019-09-02 DIAGNOSIS — G4736 Sleep related hypoventilation in conditions classified elsewhere: Secondary | ICD-10-CM | POA: Diagnosis not present

## 2019-09-06 DIAGNOSIS — R634 Abnormal weight loss: Secondary | ICD-10-CM | POA: Diagnosis not present

## 2019-09-06 DIAGNOSIS — Z8701 Personal history of pneumonia (recurrent): Secondary | ICD-10-CM | POA: Diagnosis not present

## 2019-09-06 DIAGNOSIS — G1221 Amyotrophic lateral sclerosis: Secondary | ICD-10-CM | POA: Diagnosis not present

## 2019-09-06 DIAGNOSIS — G4736 Sleep related hypoventilation in conditions classified elsewhere: Secondary | ICD-10-CM | POA: Diagnosis not present

## 2019-09-06 DIAGNOSIS — R1310 Dysphagia, unspecified: Secondary | ICD-10-CM | POA: Diagnosis not present

## 2019-09-06 DIAGNOSIS — R471 Dysarthria and anarthria: Secondary | ICD-10-CM | POA: Diagnosis not present

## 2019-09-09 DIAGNOSIS — G4736 Sleep related hypoventilation in conditions classified elsewhere: Secondary | ICD-10-CM | POA: Diagnosis not present

## 2019-09-09 DIAGNOSIS — R1310 Dysphagia, unspecified: Secondary | ICD-10-CM | POA: Diagnosis not present

## 2019-09-09 DIAGNOSIS — R634 Abnormal weight loss: Secondary | ICD-10-CM | POA: Diagnosis not present

## 2019-09-09 DIAGNOSIS — G1221 Amyotrophic lateral sclerosis: Secondary | ICD-10-CM | POA: Diagnosis not present

## 2019-09-09 DIAGNOSIS — R471 Dysarthria and anarthria: Secondary | ICD-10-CM | POA: Diagnosis not present

## 2019-09-09 DIAGNOSIS — Z8701 Personal history of pneumonia (recurrent): Secondary | ICD-10-CM | POA: Diagnosis not present

## 2019-09-10 DIAGNOSIS — G1221 Amyotrophic lateral sclerosis: Secondary | ICD-10-CM | POA: Diagnosis not present

## 2019-09-10 DIAGNOSIS — R1310 Dysphagia, unspecified: Secondary | ICD-10-CM | POA: Diagnosis not present

## 2019-09-10 DIAGNOSIS — R634 Abnormal weight loss: Secondary | ICD-10-CM | POA: Diagnosis not present

## 2019-09-10 DIAGNOSIS — G4736 Sleep related hypoventilation in conditions classified elsewhere: Secondary | ICD-10-CM | POA: Diagnosis not present

## 2019-09-10 DIAGNOSIS — Z8701 Personal history of pneumonia (recurrent): Secondary | ICD-10-CM | POA: Diagnosis not present

## 2019-09-10 DIAGNOSIS — R471 Dysarthria and anarthria: Secondary | ICD-10-CM | POA: Diagnosis not present

## 2019-09-11 DIAGNOSIS — G1221 Amyotrophic lateral sclerosis: Secondary | ICD-10-CM | POA: Diagnosis not present

## 2019-09-11 DIAGNOSIS — G4736 Sleep related hypoventilation in conditions classified elsewhere: Secondary | ICD-10-CM | POA: Diagnosis not present

## 2019-09-11 DIAGNOSIS — Z8701 Personal history of pneumonia (recurrent): Secondary | ICD-10-CM | POA: Diagnosis not present

## 2019-09-11 DIAGNOSIS — R471 Dysarthria and anarthria: Secondary | ICD-10-CM | POA: Diagnosis not present

## 2019-09-11 DIAGNOSIS — R1310 Dysphagia, unspecified: Secondary | ICD-10-CM | POA: Diagnosis not present

## 2019-09-11 DIAGNOSIS — R634 Abnormal weight loss: Secondary | ICD-10-CM | POA: Diagnosis not present

## 2019-09-11 DIAGNOSIS — E0590 Thyrotoxicosis, unspecified without thyrotoxic crisis or storm: Secondary | ICD-10-CM | POA: Diagnosis not present

## 2019-09-11 DIAGNOSIS — R64 Cachexia: Secondary | ICD-10-CM | POA: Diagnosis not present

## 2019-09-11 DIAGNOSIS — Z9989 Dependence on other enabling machines and devices: Secondary | ICD-10-CM | POA: Diagnosis not present

## 2019-09-13 DIAGNOSIS — G4736 Sleep related hypoventilation in conditions classified elsewhere: Secondary | ICD-10-CM | POA: Diagnosis not present

## 2019-09-13 DIAGNOSIS — Z8701 Personal history of pneumonia (recurrent): Secondary | ICD-10-CM | POA: Diagnosis not present

## 2019-09-13 DIAGNOSIS — R634 Abnormal weight loss: Secondary | ICD-10-CM | POA: Diagnosis not present

## 2019-09-13 DIAGNOSIS — R1310 Dysphagia, unspecified: Secondary | ICD-10-CM | POA: Diagnosis not present

## 2019-09-13 DIAGNOSIS — G1221 Amyotrophic lateral sclerosis: Secondary | ICD-10-CM | POA: Diagnosis not present

## 2019-09-13 DIAGNOSIS — R471 Dysarthria and anarthria: Secondary | ICD-10-CM | POA: Diagnosis not present

## 2019-09-16 DIAGNOSIS — R1310 Dysphagia, unspecified: Secondary | ICD-10-CM | POA: Diagnosis not present

## 2019-09-16 DIAGNOSIS — R471 Dysarthria and anarthria: Secondary | ICD-10-CM | POA: Diagnosis not present

## 2019-09-16 DIAGNOSIS — G1221 Amyotrophic lateral sclerosis: Secondary | ICD-10-CM | POA: Diagnosis not present

## 2019-09-16 DIAGNOSIS — R634 Abnormal weight loss: Secondary | ICD-10-CM | POA: Diagnosis not present

## 2019-09-16 DIAGNOSIS — Z8701 Personal history of pneumonia (recurrent): Secondary | ICD-10-CM | POA: Diagnosis not present

## 2019-09-16 DIAGNOSIS — G4736 Sleep related hypoventilation in conditions classified elsewhere: Secondary | ICD-10-CM | POA: Diagnosis not present

## 2019-09-22 DIAGNOSIS — R471 Dysarthria and anarthria: Secondary | ICD-10-CM | POA: Diagnosis not present

## 2019-09-22 DIAGNOSIS — G1221 Amyotrophic lateral sclerosis: Secondary | ICD-10-CM | POA: Diagnosis not present

## 2019-09-22 DIAGNOSIS — R634 Abnormal weight loss: Secondary | ICD-10-CM | POA: Diagnosis not present

## 2019-09-22 DIAGNOSIS — G4736 Sleep related hypoventilation in conditions classified elsewhere: Secondary | ICD-10-CM | POA: Diagnosis not present

## 2019-09-22 DIAGNOSIS — R1310 Dysphagia, unspecified: Secondary | ICD-10-CM | POA: Diagnosis not present

## 2019-09-22 DIAGNOSIS — Z8701 Personal history of pneumonia (recurrent): Secondary | ICD-10-CM | POA: Diagnosis not present

## 2019-09-23 DIAGNOSIS — R634 Abnormal weight loss: Secondary | ICD-10-CM | POA: Diagnosis not present

## 2019-09-23 DIAGNOSIS — R1310 Dysphagia, unspecified: Secondary | ICD-10-CM | POA: Diagnosis not present

## 2019-09-23 DIAGNOSIS — R471 Dysarthria and anarthria: Secondary | ICD-10-CM | POA: Diagnosis not present

## 2019-09-23 DIAGNOSIS — G1221 Amyotrophic lateral sclerosis: Secondary | ICD-10-CM | POA: Diagnosis not present

## 2019-09-23 DIAGNOSIS — G4736 Sleep related hypoventilation in conditions classified elsewhere: Secondary | ICD-10-CM | POA: Diagnosis not present

## 2019-09-23 DIAGNOSIS — Z8701 Personal history of pneumonia (recurrent): Secondary | ICD-10-CM | POA: Diagnosis not present

## 2019-09-24 DIAGNOSIS — R1310 Dysphagia, unspecified: Secondary | ICD-10-CM | POA: Diagnosis not present

## 2019-09-24 DIAGNOSIS — R634 Abnormal weight loss: Secondary | ICD-10-CM | POA: Diagnosis not present

## 2019-09-24 DIAGNOSIS — G4736 Sleep related hypoventilation in conditions classified elsewhere: Secondary | ICD-10-CM | POA: Diagnosis not present

## 2019-09-24 DIAGNOSIS — Z8701 Personal history of pneumonia (recurrent): Secondary | ICD-10-CM | POA: Diagnosis not present

## 2019-09-24 DIAGNOSIS — R471 Dysarthria and anarthria: Secondary | ICD-10-CM | POA: Diagnosis not present

## 2019-09-24 DIAGNOSIS — G1221 Amyotrophic lateral sclerosis: Secondary | ICD-10-CM | POA: Diagnosis not present

## 2019-09-27 DIAGNOSIS — R1310 Dysphagia, unspecified: Secondary | ICD-10-CM | POA: Diagnosis not present

## 2019-09-27 DIAGNOSIS — G1221 Amyotrophic lateral sclerosis: Secondary | ICD-10-CM | POA: Diagnosis not present

## 2019-09-27 DIAGNOSIS — R471 Dysarthria and anarthria: Secondary | ICD-10-CM | POA: Diagnosis not present

## 2019-09-27 DIAGNOSIS — Z8701 Personal history of pneumonia (recurrent): Secondary | ICD-10-CM | POA: Diagnosis not present

## 2019-09-27 DIAGNOSIS — R634 Abnormal weight loss: Secondary | ICD-10-CM | POA: Diagnosis not present

## 2019-09-27 DIAGNOSIS — G4736 Sleep related hypoventilation in conditions classified elsewhere: Secondary | ICD-10-CM | POA: Diagnosis not present

## 2019-09-30 DIAGNOSIS — R471 Dysarthria and anarthria: Secondary | ICD-10-CM | POA: Diagnosis not present

## 2019-09-30 DIAGNOSIS — R634 Abnormal weight loss: Secondary | ICD-10-CM | POA: Diagnosis not present

## 2019-09-30 DIAGNOSIS — G1221 Amyotrophic lateral sclerosis: Secondary | ICD-10-CM | POA: Diagnosis not present

## 2019-09-30 DIAGNOSIS — R1310 Dysphagia, unspecified: Secondary | ICD-10-CM | POA: Diagnosis not present

## 2019-09-30 DIAGNOSIS — G4736 Sleep related hypoventilation in conditions classified elsewhere: Secondary | ICD-10-CM | POA: Diagnosis not present

## 2019-09-30 DIAGNOSIS — Z8701 Personal history of pneumonia (recurrent): Secondary | ICD-10-CM | POA: Diagnosis not present

## 2019-10-01 DIAGNOSIS — R634 Abnormal weight loss: Secondary | ICD-10-CM | POA: Diagnosis not present

## 2019-10-01 DIAGNOSIS — R1310 Dysphagia, unspecified: Secondary | ICD-10-CM | POA: Diagnosis not present

## 2019-10-01 DIAGNOSIS — Z8701 Personal history of pneumonia (recurrent): Secondary | ICD-10-CM | POA: Diagnosis not present

## 2019-10-01 DIAGNOSIS — G1221 Amyotrophic lateral sclerosis: Secondary | ICD-10-CM | POA: Diagnosis not present

## 2019-10-01 DIAGNOSIS — G4736 Sleep related hypoventilation in conditions classified elsewhere: Secondary | ICD-10-CM | POA: Diagnosis not present

## 2019-10-01 DIAGNOSIS — R471 Dysarthria and anarthria: Secondary | ICD-10-CM | POA: Diagnosis not present

## 2019-10-04 DIAGNOSIS — R634 Abnormal weight loss: Secondary | ICD-10-CM | POA: Diagnosis not present

## 2019-10-04 DIAGNOSIS — R1310 Dysphagia, unspecified: Secondary | ICD-10-CM | POA: Diagnosis not present

## 2019-10-04 DIAGNOSIS — G1221 Amyotrophic lateral sclerosis: Secondary | ICD-10-CM | POA: Diagnosis not present

## 2019-10-04 DIAGNOSIS — G4736 Sleep related hypoventilation in conditions classified elsewhere: Secondary | ICD-10-CM | POA: Diagnosis not present

## 2019-10-04 DIAGNOSIS — Z8701 Personal history of pneumonia (recurrent): Secondary | ICD-10-CM | POA: Diagnosis not present

## 2019-10-04 DIAGNOSIS — R471 Dysarthria and anarthria: Secondary | ICD-10-CM | POA: Diagnosis not present

## 2019-10-07 DIAGNOSIS — G1221 Amyotrophic lateral sclerosis: Secondary | ICD-10-CM | POA: Diagnosis not present

## 2019-10-07 DIAGNOSIS — R1310 Dysphagia, unspecified: Secondary | ICD-10-CM | POA: Diagnosis not present

## 2019-10-07 DIAGNOSIS — R471 Dysarthria and anarthria: Secondary | ICD-10-CM | POA: Diagnosis not present

## 2019-10-07 DIAGNOSIS — Z8701 Personal history of pneumonia (recurrent): Secondary | ICD-10-CM | POA: Diagnosis not present

## 2019-10-07 DIAGNOSIS — G4736 Sleep related hypoventilation in conditions classified elsewhere: Secondary | ICD-10-CM | POA: Diagnosis not present

## 2019-10-07 DIAGNOSIS — R634 Abnormal weight loss: Secondary | ICD-10-CM | POA: Diagnosis not present

## 2019-11-07 ENCOUNTER — Other Ambulatory Visit: Payer: Self-pay

## 2019-11-07 ENCOUNTER — Ambulatory Visit (INDEPENDENT_AMBULATORY_CARE_PROVIDER_SITE_OTHER): Payer: Medicare Other

## 2019-11-07 ENCOUNTER — Ambulatory Visit
Admission: EM | Admit: 2019-11-07 | Discharge: 2019-11-07 | Disposition: A | Discharge status: Home / Self Care | Payer: Medicare Other

## 2019-11-07 DIAGNOSIS — S8992XA Unspecified injury of left lower leg, initial encounter: Secondary | ICD-10-CM | POA: Diagnosis not present

## 2019-11-07 DIAGNOSIS — W19XXXA Unspecified fall, initial encounter: Secondary | ICD-10-CM

## 2019-11-07 DIAGNOSIS — M25562 Pain in left knee: Secondary | ICD-10-CM

## 2019-11-07 NOTE — Discharge Instructions (Signed)
Xray negative for obvious fractures. As discussed, this does not rule out meniscus/tendon injury. If symptoms not improving, to follow up with PCP. Ice compress, rest, knee sleeve as needed.  As discussed, given head injury, please continue to monitor symptoms. If having headaches, vomiting, confusion, changes in normal balance, go to the ED for further evaluation.

## 2019-11-07 NOTE — ED Provider Notes (Signed)
[facility]    CSN: [id number] Arrival date & time: 11/07/19  1526      History   Chief Complaint Chief Complaint  Patient presents with  . Leg Injury    left knee; fell yesterday    HPI Albert Leonard is a 74 y.o. male.   74 year old male with history of ALS comes in with wife for left knee pain after fall yesterday.  Patient was ambulating on own, when he lost balance and fell.  Hit his lips, left knee.  Denies loss of consciousness.  Has left knee swelling and pain since fall, but still able to ambulate on own.  Wife states no changes from baseline, no vomiting.  Not on blood thinners.     Past Medical History:  Diagnosis Date  . [facility] disease [facility])   . Medical history non-contributory     Patient Active Problem List   Diagnosis Date Noted  . Respiratory distress 01/04/2018  . Amyotrophic lateral sclerosis (ALS) ([place])   . Pneumonia 12/31/2017    Past Surgical History:  Procedure Laterality Date  . COLONOSCOPY WITH PROPOFOL N/A 02/16/2013   Procedure: COLONOSCOPY WITH PROPOFOL;  Surgeon: Garlan Fair, MD;  Location: [facility] ENDOSCOPY;  Service: Endoscopy;  Laterality: N/A;  . colonscopy    . UPPER GI ENDOSCOPY  2013       Home Medications    Prior to Admission medications   Medication Sig Start Date End Date Taking? Authorizing Provider  LORazepam (ATIVAN) 1 MG tablet Take 1 mg by mouth at bedtime. 11/04/19   [provider]  oxyCODONE (OXY IR/ROXICODONE) 5 MG immediate release tablet Take 1 tablet by mouth at bedtime. 10/21/19   [provider]  QUEtiapine (SEROQUEL) 50 MG tablet SMARTSIG:2 Tablet(s) By Mouth Every Evening 10/21/19   [provider]  riluzole (RILUTEK) 50 MG tablet Take 50 mg by mouth every 12 (twelve) hours.    [provider]  traZODone (DESYREL) 50 MG tablet TAKE 3 TABLETS BY MOUTH AT BEDTIME AT 7 PM 09/30/19   [provider]    Family History History reviewed. No pertinent  family history.  Social History Social History   Tobacco Use  . Smoking status: Never Smoker  . Smokeless tobacco: Never Used  Substance Use Topics  . Alcohol use: No  . Drug use: No     Allergies   Patient has no known allergies.   Review of Systems Review of Systems  Reason unable to perform ROS: See HPI as above.     Physical Exam Triage Vital Signs ED Triage Vitals  Enc Vitals Group     BP 11/07/19 1538 115/73     Pulse --      Resp 11/07/19 1538 16     Temp 11/07/19 1538 97.8 F (36.6 C)     Temp Source 11/07/19 1538 Tympanic     SpO2 11/07/19 1538 93 %     Weight --      Height --      Head Circumference --      Peak Flow --      Pain Score 11/07/19 1542 4     Pain Loc --      Pain Edu? --      Excl. in [place]? --    No data found.  Updated Vital Signs BP 115/73 (BP Location: Right Arm)   Temp 97.8 F (36.6 C) (Tympanic)   Resp 16   SpO2 93%   Physical Exam  Constitutional:      General: He is not in acute distress.    Appearance: Normal appearance. He is well-developed. He is not toxic-appearing or diaphoretic.     Comments: Wearing home O2.  HENT:     Head: Normocephalic and atraumatic.  Eyes:     Conjunctiva/sclera: Conjunctivae normal.     Pupils: Pupils are equal, round, and reactive to light.  Pulmonary:     Effort: Pulmonary effort is normal. No respiratory distress.     Comments: Speaking in full sentences without difficulty Musculoskeletal:     Cervical back: Normal range of motion and neck supple.     Comments: Swelling to left knee inferolaterally with contusion. Scabbing to tibial tuberosity from previous injury. Tenderness to palpation along patellar tendon, lateral joint line. Tenderness to palpation of proximal fibula. Full extension, can flex knee on own to 90 degree. Given patient in wheelchair, did not ambulate to attempt further ROM. Sensation intact. Pedal pulse 2+  Skin:    General: Skin is warm and dry.  Neurological:      Mental Status: He is alert and oriented to person, place, and time. Mental status is at baseline.    UC Treatments / Results  Labs (all labs ordered are listed, but only abnormal results are displayed) Labs Reviewed - No data to display  EKG   Radiology DG Knee Complete 4 Views Left  Result Date: 11/07/2019 CLINICAL DATA:  Injury, pain EXAM: LEFT KNEE - COMPLETE 4+ VIEW COMPARISON:  None. FINDINGS: No acute bony abnormality. Specifically, no fracture, subluxation, or dislocation. No joint effusion. IMPRESSION: No acute bony abnormality. Electronically Signed   By: Rolm Baptise M.D.   On: 11/07/2019 16:22    Procedures Procedures (including critical care time)  Medications Ordered in UC Medications - No data to display  Initial Impression / Assessment and Plan / UC Course  I have reviewed the triage vital signs and the nursing notes.  Pertinent labs & imaging results that were available during my care of the patient were reviewed by me and considered in my medical decision making (see chart for details).    Discussed given fall with head injury, cannot rule out intracranial bleed. However, fall approx 24 hours ago, without headache, nausea/vomiting, changes to mental status. Patient and spouse would like to monitor and defer ED visit at this time.  Given tenderness to proximal fibula, will obtain xray for further evaluation. Xray negative for obvious fracture. Will provide knee sleeve for symptomatic management. Ice compress, rest. Follow up with PCP if symptoms not improving. Return precautions given.  Final Clinical Impressions(s) / UC Diagnoses   Final diagnoses:  Acute pain of left knee  Fall, initial encounter    ED Prescriptions    None     PDMP not reviewed this encounter.   Ok Edwards, PA-C 11/07/19 [phone]

## 2019-11-07 NOTE — ED Triage Notes (Signed)
Patient is here for evaluation of left knee after a fall yesterday.  Per patient's spouse she was instructed by Hospice Nurse to come in for X-ray.

## 2019-11-11 DIAGNOSIS — E0590 Thyrotoxicosis, unspecified without thyrotoxic crisis or storm: Secondary | ICD-10-CM | POA: Diagnosis not present

## 2019-11-11 DIAGNOSIS — Z8701 Personal history of pneumonia (recurrent): Secondary | ICD-10-CM | POA: Diagnosis not present

## 2019-11-11 DIAGNOSIS — R64 Cachexia: Secondary | ICD-10-CM | POA: Diagnosis not present

## 2019-11-11 DIAGNOSIS — R471 Dysarthria and anarthria: Secondary | ICD-10-CM | POA: Diagnosis not present

## 2019-11-11 DIAGNOSIS — R1310 Dysphagia, unspecified: Secondary | ICD-10-CM | POA: Diagnosis not present

## 2019-11-11 DIAGNOSIS — G1221 Amyotrophic lateral sclerosis: Secondary | ICD-10-CM | POA: Diagnosis not present

## 2019-11-11 DIAGNOSIS — G4736 Sleep related hypoventilation in conditions classified elsewhere: Secondary | ICD-10-CM | POA: Diagnosis not present

## 2019-11-11 DIAGNOSIS — R634 Abnormal weight loss: Secondary | ICD-10-CM | POA: Diagnosis not present

## 2019-11-11 DIAGNOSIS — Z9989 Dependence on other enabling machines and devices: Secondary | ICD-10-CM | POA: Diagnosis not present

## 2019-11-12 DIAGNOSIS — R634 Abnormal weight loss: Secondary | ICD-10-CM | POA: Diagnosis not present

## 2019-11-12 DIAGNOSIS — G1221 Amyotrophic lateral sclerosis: Secondary | ICD-10-CM | POA: Diagnosis not present

## 2019-11-12 DIAGNOSIS — R471 Dysarthria and anarthria: Secondary | ICD-10-CM | POA: Diagnosis not present

## 2019-11-12 DIAGNOSIS — Z8701 Personal history of pneumonia (recurrent): Secondary | ICD-10-CM | POA: Diagnosis not present

## 2019-11-12 DIAGNOSIS — G4736 Sleep related hypoventilation in conditions classified elsewhere: Secondary | ICD-10-CM | POA: Diagnosis not present

## 2019-11-12 DIAGNOSIS — R1310 Dysphagia, unspecified: Secondary | ICD-10-CM | POA: Diagnosis not present

## 2019-11-17 DIAGNOSIS — G1221 Amyotrophic lateral sclerosis: Secondary | ICD-10-CM | POA: Diagnosis not present

## 2019-11-17 DIAGNOSIS — G4736 Sleep related hypoventilation in conditions classified elsewhere: Secondary | ICD-10-CM | POA: Diagnosis not present

## 2019-11-17 DIAGNOSIS — R634 Abnormal weight loss: Secondary | ICD-10-CM | POA: Diagnosis not present

## 2019-11-17 DIAGNOSIS — R1310 Dysphagia, unspecified: Secondary | ICD-10-CM | POA: Diagnosis not present

## 2019-11-17 DIAGNOSIS — R471 Dysarthria and anarthria: Secondary | ICD-10-CM | POA: Diagnosis not present

## 2019-11-17 DIAGNOSIS — Z8701 Personal history of pneumonia (recurrent): Secondary | ICD-10-CM | POA: Diagnosis not present

## 2019-11-19 DIAGNOSIS — G1221 Amyotrophic lateral sclerosis: Secondary | ICD-10-CM | POA: Diagnosis not present

## 2019-11-19 DIAGNOSIS — G4736 Sleep related hypoventilation in conditions classified elsewhere: Secondary | ICD-10-CM | POA: Diagnosis not present

## 2019-11-19 DIAGNOSIS — Z8701 Personal history of pneumonia (recurrent): Secondary | ICD-10-CM | POA: Diagnosis not present

## 2019-11-19 DIAGNOSIS — R471 Dysarthria and anarthria: Secondary | ICD-10-CM | POA: Diagnosis not present

## 2019-11-19 DIAGNOSIS — R1310 Dysphagia, unspecified: Secondary | ICD-10-CM | POA: Diagnosis not present

## 2019-11-19 DIAGNOSIS — R634 Abnormal weight loss: Secondary | ICD-10-CM | POA: Diagnosis not present

## 2019-11-24 DIAGNOSIS — R471 Dysarthria and anarthria: Secondary | ICD-10-CM | POA: Diagnosis not present

## 2019-11-24 DIAGNOSIS — R1310 Dysphagia, unspecified: Secondary | ICD-10-CM | POA: Diagnosis not present

## 2019-11-24 DIAGNOSIS — R634 Abnormal weight loss: Secondary | ICD-10-CM | POA: Diagnosis not present

## 2019-11-24 DIAGNOSIS — Z8701 Personal history of pneumonia (recurrent): Secondary | ICD-10-CM | POA: Diagnosis not present

## 2019-11-24 DIAGNOSIS — G1221 Amyotrophic lateral sclerosis: Secondary | ICD-10-CM | POA: Diagnosis not present

## 2019-11-24 DIAGNOSIS — G4736 Sleep related hypoventilation in conditions classified elsewhere: Secondary | ICD-10-CM | POA: Diagnosis not present

## 2019-11-25 DIAGNOSIS — R471 Dysarthria and anarthria: Secondary | ICD-10-CM | POA: Diagnosis not present

## 2019-11-25 DIAGNOSIS — G4736 Sleep related hypoventilation in conditions classified elsewhere: Secondary | ICD-10-CM | POA: Diagnosis not present

## 2019-11-25 DIAGNOSIS — R1310 Dysphagia, unspecified: Secondary | ICD-10-CM | POA: Diagnosis not present

## 2019-11-25 DIAGNOSIS — R634 Abnormal weight loss: Secondary | ICD-10-CM | POA: Diagnosis not present

## 2019-11-25 DIAGNOSIS — Z8701 Personal history of pneumonia (recurrent): Secondary | ICD-10-CM | POA: Diagnosis not present

## 2019-11-25 DIAGNOSIS — G1221 Amyotrophic lateral sclerosis: Secondary | ICD-10-CM | POA: Diagnosis not present

## 2019-11-29 DIAGNOSIS — R1310 Dysphagia, unspecified: Secondary | ICD-10-CM | POA: Diagnosis not present

## 2019-11-29 DIAGNOSIS — Z8701 Personal history of pneumonia (recurrent): Secondary | ICD-10-CM | POA: Diagnosis not present

## 2019-11-29 DIAGNOSIS — R634 Abnormal weight loss: Secondary | ICD-10-CM | POA: Diagnosis not present

## 2019-11-29 DIAGNOSIS — R471 Dysarthria and anarthria: Secondary | ICD-10-CM | POA: Diagnosis not present

## 2019-11-29 DIAGNOSIS — G1221 Amyotrophic lateral sclerosis: Secondary | ICD-10-CM | POA: Diagnosis not present

## 2019-11-29 DIAGNOSIS — G4736 Sleep related hypoventilation in conditions classified elsewhere: Secondary | ICD-10-CM | POA: Diagnosis not present

## 2019-12-01 DIAGNOSIS — R1310 Dysphagia, unspecified: Secondary | ICD-10-CM | POA: Diagnosis not present

## 2019-12-01 DIAGNOSIS — R471 Dysarthria and anarthria: Secondary | ICD-10-CM | POA: Diagnosis not present

## 2019-12-01 DIAGNOSIS — G1221 Amyotrophic lateral sclerosis: Secondary | ICD-10-CM | POA: Diagnosis not present

## 2019-12-01 DIAGNOSIS — R634 Abnormal weight loss: Secondary | ICD-10-CM | POA: Diagnosis not present

## 2019-12-01 DIAGNOSIS — Z8701 Personal history of pneumonia (recurrent): Secondary | ICD-10-CM | POA: Diagnosis not present

## 2019-12-01 DIAGNOSIS — G4736 Sleep related hypoventilation in conditions classified elsewhere: Secondary | ICD-10-CM | POA: Diagnosis not present

## 2019-12-03 DIAGNOSIS — G4736 Sleep related hypoventilation in conditions classified elsewhere: Secondary | ICD-10-CM | POA: Diagnosis not present

## 2019-12-03 DIAGNOSIS — G1221 Amyotrophic lateral sclerosis: Secondary | ICD-10-CM | POA: Diagnosis not present

## 2019-12-03 DIAGNOSIS — R634 Abnormal weight loss: Secondary | ICD-10-CM | POA: Diagnosis not present

## 2019-12-03 DIAGNOSIS — Z8701 Personal history of pneumonia (recurrent): Secondary | ICD-10-CM | POA: Diagnosis not present

## 2019-12-03 DIAGNOSIS — R1310 Dysphagia, unspecified: Secondary | ICD-10-CM | POA: Diagnosis not present

## 2019-12-03 DIAGNOSIS — R471 Dysarthria and anarthria: Secondary | ICD-10-CM | POA: Diagnosis not present

## 2019-12-06 DIAGNOSIS — R1310 Dysphagia, unspecified: Secondary | ICD-10-CM | POA: Diagnosis not present

## 2019-12-06 DIAGNOSIS — G4736 Sleep related hypoventilation in conditions classified elsewhere: Secondary | ICD-10-CM | POA: Diagnosis not present

## 2019-12-06 DIAGNOSIS — R634 Abnormal weight loss: Secondary | ICD-10-CM | POA: Diagnosis not present

## 2019-12-06 DIAGNOSIS — G1221 Amyotrophic lateral sclerosis: Secondary | ICD-10-CM | POA: Diagnosis not present

## 2019-12-06 DIAGNOSIS — R471 Dysarthria and anarthria: Secondary | ICD-10-CM | POA: Diagnosis not present

## 2019-12-06 DIAGNOSIS — Z8701 Personal history of pneumonia (recurrent): Secondary | ICD-10-CM | POA: Diagnosis not present

## 2019-12-08 DIAGNOSIS — R634 Abnormal weight loss: Secondary | ICD-10-CM | POA: Diagnosis not present

## 2019-12-08 DIAGNOSIS — Z8701 Personal history of pneumonia (recurrent): Secondary | ICD-10-CM | POA: Diagnosis not present

## 2019-12-08 DIAGNOSIS — R471 Dysarthria and anarthria: Secondary | ICD-10-CM | POA: Diagnosis not present

## 2019-12-08 DIAGNOSIS — R1310 Dysphagia, unspecified: Secondary | ICD-10-CM | POA: Diagnosis not present

## 2019-12-08 DIAGNOSIS — G4736 Sleep related hypoventilation in conditions classified elsewhere: Secondary | ICD-10-CM | POA: Diagnosis not present

## 2019-12-08 DIAGNOSIS — G1221 Amyotrophic lateral sclerosis: Secondary | ICD-10-CM | POA: Diagnosis not present

## 2019-12-09 DIAGNOSIS — R471 Dysarthria and anarthria: Secondary | ICD-10-CM | POA: Diagnosis not present

## 2019-12-09 DIAGNOSIS — G4736 Sleep related hypoventilation in conditions classified elsewhere: Secondary | ICD-10-CM | POA: Diagnosis not present

## 2019-12-09 DIAGNOSIS — G1221 Amyotrophic lateral sclerosis: Secondary | ICD-10-CM | POA: Diagnosis not present

## 2019-12-09 DIAGNOSIS — Z8701 Personal history of pneumonia (recurrent): Secondary | ICD-10-CM | POA: Diagnosis not present

## 2019-12-09 DIAGNOSIS — R634 Abnormal weight loss: Secondary | ICD-10-CM | POA: Diagnosis not present

## 2019-12-09 DIAGNOSIS — R1310 Dysphagia, unspecified: Secondary | ICD-10-CM | POA: Diagnosis not present

## 2019-12-12 DIAGNOSIS — Z8701 Personal history of pneumonia (recurrent): Secondary | ICD-10-CM | POA: Diagnosis not present

## 2019-12-12 DIAGNOSIS — E0590 Thyrotoxicosis, unspecified without thyrotoxic crisis or storm: Secondary | ICD-10-CM | POA: Diagnosis not present

## 2019-12-12 DIAGNOSIS — R64 Cachexia: Secondary | ICD-10-CM | POA: Diagnosis not present

## 2019-12-12 DIAGNOSIS — R471 Dysarthria and anarthria: Secondary | ICD-10-CM | POA: Diagnosis not present

## 2019-12-12 DIAGNOSIS — R1310 Dysphagia, unspecified: Secondary | ICD-10-CM | POA: Diagnosis not present

## 2019-12-12 DIAGNOSIS — Z9989 Dependence on other enabling machines and devices: Secondary | ICD-10-CM | POA: Diagnosis not present

## 2019-12-12 DIAGNOSIS — G1221 Amyotrophic lateral sclerosis: Secondary | ICD-10-CM | POA: Diagnosis not present

## 2019-12-12 DIAGNOSIS — R634 Abnormal weight loss: Secondary | ICD-10-CM | POA: Diagnosis not present

## 2019-12-12 DIAGNOSIS — G4736 Sleep related hypoventilation in conditions classified elsewhere: Secondary | ICD-10-CM | POA: Diagnosis not present

## 2019-12-15 DIAGNOSIS — R634 Abnormal weight loss: Secondary | ICD-10-CM | POA: Diagnosis not present

## 2019-12-15 DIAGNOSIS — G4736 Sleep related hypoventilation in conditions classified elsewhere: Secondary | ICD-10-CM | POA: Diagnosis not present

## 2019-12-15 DIAGNOSIS — R1310 Dysphagia, unspecified: Secondary | ICD-10-CM | POA: Diagnosis not present

## 2019-12-15 DIAGNOSIS — Z8701 Personal history of pneumonia (recurrent): Secondary | ICD-10-CM | POA: Diagnosis not present

## 2019-12-15 DIAGNOSIS — R471 Dysarthria and anarthria: Secondary | ICD-10-CM | POA: Diagnosis not present

## 2019-12-15 DIAGNOSIS — G1221 Amyotrophic lateral sclerosis: Secondary | ICD-10-CM | POA: Diagnosis not present

## 2019-12-16 DIAGNOSIS — R634 Abnormal weight loss: Secondary | ICD-10-CM | POA: Diagnosis not present

## 2019-12-16 DIAGNOSIS — Z8701 Personal history of pneumonia (recurrent): Secondary | ICD-10-CM | POA: Diagnosis not present

## 2019-12-16 DIAGNOSIS — G4736 Sleep related hypoventilation in conditions classified elsewhere: Secondary | ICD-10-CM | POA: Diagnosis not present

## 2019-12-16 DIAGNOSIS — R471 Dysarthria and anarthria: Secondary | ICD-10-CM | POA: Diagnosis not present

## 2019-12-16 DIAGNOSIS — G1221 Amyotrophic lateral sclerosis: Secondary | ICD-10-CM | POA: Diagnosis not present

## 2019-12-16 DIAGNOSIS — R1310 Dysphagia, unspecified: Secondary | ICD-10-CM | POA: Diagnosis not present

## 2019-12-20 DIAGNOSIS — R634 Abnormal weight loss: Secondary | ICD-10-CM | POA: Diagnosis not present

## 2019-12-20 DIAGNOSIS — G4736 Sleep related hypoventilation in conditions classified elsewhere: Secondary | ICD-10-CM | POA: Diagnosis not present

## 2019-12-20 DIAGNOSIS — R1310 Dysphagia, unspecified: Secondary | ICD-10-CM | POA: Diagnosis not present

## 2019-12-20 DIAGNOSIS — R471 Dysarthria and anarthria: Secondary | ICD-10-CM | POA: Diagnosis not present

## 2019-12-20 DIAGNOSIS — G1221 Amyotrophic lateral sclerosis: Secondary | ICD-10-CM | POA: Diagnosis not present

## 2019-12-20 DIAGNOSIS — Z8701 Personal history of pneumonia (recurrent): Secondary | ICD-10-CM | POA: Diagnosis not present

## 2019-12-22 DIAGNOSIS — G4736 Sleep related hypoventilation in conditions classified elsewhere: Secondary | ICD-10-CM | POA: Diagnosis not present

## 2019-12-22 DIAGNOSIS — R1310 Dysphagia, unspecified: Secondary | ICD-10-CM | POA: Diagnosis not present

## 2019-12-22 DIAGNOSIS — G1221 Amyotrophic lateral sclerosis: Secondary | ICD-10-CM | POA: Diagnosis not present

## 2019-12-22 DIAGNOSIS — Z8701 Personal history of pneumonia (recurrent): Secondary | ICD-10-CM | POA: Diagnosis not present

## 2019-12-22 DIAGNOSIS — R471 Dysarthria and anarthria: Secondary | ICD-10-CM | POA: Diagnosis not present

## 2019-12-22 DIAGNOSIS — R634 Abnormal weight loss: Secondary | ICD-10-CM | POA: Diagnosis not present

## 2019-12-24 DIAGNOSIS — R1310 Dysphagia, unspecified: Secondary | ICD-10-CM | POA: Diagnosis not present

## 2019-12-24 DIAGNOSIS — R634 Abnormal weight loss: Secondary | ICD-10-CM | POA: Diagnosis not present

## 2019-12-24 DIAGNOSIS — R471 Dysarthria and anarthria: Secondary | ICD-10-CM | POA: Diagnosis not present

## 2019-12-24 DIAGNOSIS — Z8701 Personal history of pneumonia (recurrent): Secondary | ICD-10-CM | POA: Diagnosis not present

## 2019-12-24 DIAGNOSIS — G1221 Amyotrophic lateral sclerosis: Secondary | ICD-10-CM | POA: Diagnosis not present

## 2019-12-24 DIAGNOSIS — G4736 Sleep related hypoventilation in conditions classified elsewhere: Secondary | ICD-10-CM | POA: Diagnosis not present

## 2019-12-29 DIAGNOSIS — R634 Abnormal weight loss: Secondary | ICD-10-CM | POA: Diagnosis not present

## 2019-12-29 DIAGNOSIS — G1221 Amyotrophic lateral sclerosis: Secondary | ICD-10-CM | POA: Diagnosis not present

## 2019-12-29 DIAGNOSIS — R471 Dysarthria and anarthria: Secondary | ICD-10-CM | POA: Diagnosis not present

## 2019-12-29 DIAGNOSIS — Z8701 Personal history of pneumonia (recurrent): Secondary | ICD-10-CM | POA: Diagnosis not present

## 2019-12-29 DIAGNOSIS — G4736 Sleep related hypoventilation in conditions classified elsewhere: Secondary | ICD-10-CM | POA: Diagnosis not present

## 2019-12-29 DIAGNOSIS — R1310 Dysphagia, unspecified: Secondary | ICD-10-CM | POA: Diagnosis not present

## 2019-12-30 DIAGNOSIS — G4736 Sleep related hypoventilation in conditions classified elsewhere: Secondary | ICD-10-CM | POA: Diagnosis not present

## 2019-12-30 DIAGNOSIS — Z8701 Personal history of pneumonia (recurrent): Secondary | ICD-10-CM | POA: Diagnosis not present

## 2019-12-30 DIAGNOSIS — R471 Dysarthria and anarthria: Secondary | ICD-10-CM | POA: Diagnosis not present

## 2019-12-30 DIAGNOSIS — R634 Abnormal weight loss: Secondary | ICD-10-CM | POA: Diagnosis not present

## 2019-12-30 DIAGNOSIS — G1221 Amyotrophic lateral sclerosis: Secondary | ICD-10-CM | POA: Diagnosis not present

## 2019-12-30 DIAGNOSIS — R1310 Dysphagia, unspecified: Secondary | ICD-10-CM | POA: Diagnosis not present

## 2019-12-31 DIAGNOSIS — G1221 Amyotrophic lateral sclerosis: Secondary | ICD-10-CM | POA: Diagnosis not present

## 2019-12-31 DIAGNOSIS — R634 Abnormal weight loss: Secondary | ICD-10-CM | POA: Diagnosis not present

## 2019-12-31 DIAGNOSIS — Z8701 Personal history of pneumonia (recurrent): Secondary | ICD-10-CM | POA: Diagnosis not present

## 2019-12-31 DIAGNOSIS — R1310 Dysphagia, unspecified: Secondary | ICD-10-CM | POA: Diagnosis not present

## 2019-12-31 DIAGNOSIS — R471 Dysarthria and anarthria: Secondary | ICD-10-CM | POA: Diagnosis not present

## 2019-12-31 DIAGNOSIS — G4736 Sleep related hypoventilation in conditions classified elsewhere: Secondary | ICD-10-CM | POA: Diagnosis not present

## 2020-01-03 DIAGNOSIS — R1310 Dysphagia, unspecified: Secondary | ICD-10-CM | POA: Diagnosis not present

## 2020-01-03 DIAGNOSIS — G4736 Sleep related hypoventilation in conditions classified elsewhere: Secondary | ICD-10-CM | POA: Diagnosis not present

## 2020-01-03 DIAGNOSIS — Z8701 Personal history of pneumonia (recurrent): Secondary | ICD-10-CM | POA: Diagnosis not present

## 2020-01-03 DIAGNOSIS — R471 Dysarthria and anarthria: Secondary | ICD-10-CM | POA: Diagnosis not present

## 2020-01-03 DIAGNOSIS — R634 Abnormal weight loss: Secondary | ICD-10-CM | POA: Diagnosis not present

## 2020-01-03 DIAGNOSIS — G1221 Amyotrophic lateral sclerosis: Secondary | ICD-10-CM | POA: Diagnosis not present

## 2020-01-06 DIAGNOSIS — R1310 Dysphagia, unspecified: Secondary | ICD-10-CM | POA: Diagnosis not present

## 2020-01-06 DIAGNOSIS — R471 Dysarthria and anarthria: Secondary | ICD-10-CM | POA: Diagnosis not present

## 2020-01-06 DIAGNOSIS — Z8701 Personal history of pneumonia (recurrent): Secondary | ICD-10-CM | POA: Diagnosis not present

## 2020-01-06 DIAGNOSIS — G4736 Sleep related hypoventilation in conditions classified elsewhere: Secondary | ICD-10-CM | POA: Diagnosis not present

## 2020-01-06 DIAGNOSIS — G1221 Amyotrophic lateral sclerosis: Secondary | ICD-10-CM | POA: Diagnosis not present

## 2020-01-06 DIAGNOSIS — R634 Abnormal weight loss: Secondary | ICD-10-CM | POA: Diagnosis not present

## 2020-01-07 DIAGNOSIS — R634 Abnormal weight loss: Secondary | ICD-10-CM | POA: Diagnosis not present

## 2020-01-07 DIAGNOSIS — G4736 Sleep related hypoventilation in conditions classified elsewhere: Secondary | ICD-10-CM | POA: Diagnosis not present

## 2020-01-07 DIAGNOSIS — R471 Dysarthria and anarthria: Secondary | ICD-10-CM | POA: Diagnosis not present

## 2020-01-07 DIAGNOSIS — G1221 Amyotrophic lateral sclerosis: Secondary | ICD-10-CM | POA: Diagnosis not present

## 2020-01-07 DIAGNOSIS — Z8701 Personal history of pneumonia (recurrent): Secondary | ICD-10-CM | POA: Diagnosis not present

## 2020-01-07 DIAGNOSIS — R1310 Dysphagia, unspecified: Secondary | ICD-10-CM | POA: Diagnosis not present

## 2020-01-10 DIAGNOSIS — R471 Dysarthria and anarthria: Secondary | ICD-10-CM | POA: Diagnosis not present

## 2020-01-10 DIAGNOSIS — R1310 Dysphagia, unspecified: Secondary | ICD-10-CM | POA: Diagnosis not present

## 2020-01-10 DIAGNOSIS — G4736 Sleep related hypoventilation in conditions classified elsewhere: Secondary | ICD-10-CM | POA: Diagnosis not present

## 2020-01-10 DIAGNOSIS — G1221 Amyotrophic lateral sclerosis: Secondary | ICD-10-CM | POA: Diagnosis not present

## 2020-01-10 DIAGNOSIS — R634 Abnormal weight loss: Secondary | ICD-10-CM | POA: Diagnosis not present

## 2020-01-10 DIAGNOSIS — Z8701 Personal history of pneumonia (recurrent): Secondary | ICD-10-CM | POA: Diagnosis not present

## 2020-01-12 DIAGNOSIS — R471 Dysarthria and anarthria: Secondary | ICD-10-CM | POA: Diagnosis not present

## 2020-01-12 DIAGNOSIS — R64 Cachexia: Secondary | ICD-10-CM | POA: Diagnosis not present

## 2020-01-12 DIAGNOSIS — R1310 Dysphagia, unspecified: Secondary | ICD-10-CM | POA: Diagnosis not present

## 2020-01-12 DIAGNOSIS — Z8701 Personal history of pneumonia (recurrent): Secondary | ICD-10-CM | POA: Diagnosis not present

## 2020-01-12 DIAGNOSIS — Z9989 Dependence on other enabling machines and devices: Secondary | ICD-10-CM | POA: Diagnosis not present

## 2020-01-12 DIAGNOSIS — G1221 Amyotrophic lateral sclerosis: Secondary | ICD-10-CM | POA: Diagnosis not present

## 2020-01-12 DIAGNOSIS — E0590 Thyrotoxicosis, unspecified without thyrotoxic crisis or storm: Secondary | ICD-10-CM | POA: Diagnosis not present

## 2020-01-12 DIAGNOSIS — R634 Abnormal weight loss: Secondary | ICD-10-CM | POA: Diagnosis not present

## 2020-01-12 DIAGNOSIS — G4736 Sleep related hypoventilation in conditions classified elsewhere: Secondary | ICD-10-CM | POA: Diagnosis not present

## 2020-01-13 DIAGNOSIS — G1221 Amyotrophic lateral sclerosis: Secondary | ICD-10-CM | POA: Diagnosis not present

## 2020-01-13 DIAGNOSIS — R1310 Dysphagia, unspecified: Secondary | ICD-10-CM | POA: Diagnosis not present

## 2020-01-13 DIAGNOSIS — R471 Dysarthria and anarthria: Secondary | ICD-10-CM | POA: Diagnosis not present

## 2020-01-13 DIAGNOSIS — Z8701 Personal history of pneumonia (recurrent): Secondary | ICD-10-CM | POA: Diagnosis not present

## 2020-01-13 DIAGNOSIS — R634 Abnormal weight loss: Secondary | ICD-10-CM | POA: Diagnosis not present

## 2020-01-13 DIAGNOSIS — G4736 Sleep related hypoventilation in conditions classified elsewhere: Secondary | ICD-10-CM | POA: Diagnosis not present

## 2020-01-14 DIAGNOSIS — R471 Dysarthria and anarthria: Secondary | ICD-10-CM | POA: Diagnosis not present

## 2020-01-14 DIAGNOSIS — G1221 Amyotrophic lateral sclerosis: Secondary | ICD-10-CM | POA: Diagnosis not present

## 2020-01-14 DIAGNOSIS — G4736 Sleep related hypoventilation in conditions classified elsewhere: Secondary | ICD-10-CM | POA: Diagnosis not present

## 2020-01-14 DIAGNOSIS — R1310 Dysphagia, unspecified: Secondary | ICD-10-CM | POA: Diagnosis not present

## 2020-01-14 DIAGNOSIS — Z8701 Personal history of pneumonia (recurrent): Secondary | ICD-10-CM | POA: Diagnosis not present

## 2020-01-14 DIAGNOSIS — R634 Abnormal weight loss: Secondary | ICD-10-CM | POA: Diagnosis not present

## 2020-01-20 DIAGNOSIS — R1310 Dysphagia, unspecified: Secondary | ICD-10-CM | POA: Diagnosis not present

## 2020-01-20 DIAGNOSIS — Z8701 Personal history of pneumonia (recurrent): Secondary | ICD-10-CM | POA: Diagnosis not present

## 2020-01-20 DIAGNOSIS — G1221 Amyotrophic lateral sclerosis: Secondary | ICD-10-CM | POA: Diagnosis not present

## 2020-01-20 DIAGNOSIS — R471 Dysarthria and anarthria: Secondary | ICD-10-CM | POA: Diagnosis not present

## 2020-01-20 DIAGNOSIS — R634 Abnormal weight loss: Secondary | ICD-10-CM | POA: Diagnosis not present

## 2020-01-20 DIAGNOSIS — G4736 Sleep related hypoventilation in conditions classified elsewhere: Secondary | ICD-10-CM | POA: Diagnosis not present

## 2020-01-21 DIAGNOSIS — R471 Dysarthria and anarthria: Secondary | ICD-10-CM | POA: Diagnosis not present

## 2020-01-21 DIAGNOSIS — R634 Abnormal weight loss: Secondary | ICD-10-CM | POA: Diagnosis not present

## 2020-01-21 DIAGNOSIS — Z8701 Personal history of pneumonia (recurrent): Secondary | ICD-10-CM | POA: Diagnosis not present

## 2020-01-21 DIAGNOSIS — G4736 Sleep related hypoventilation in conditions classified elsewhere: Secondary | ICD-10-CM | POA: Diagnosis not present

## 2020-01-21 DIAGNOSIS — G1221 Amyotrophic lateral sclerosis: Secondary | ICD-10-CM | POA: Diagnosis not present

## 2020-01-21 DIAGNOSIS — R1310 Dysphagia, unspecified: Secondary | ICD-10-CM | POA: Diagnosis not present

## 2020-01-24 DIAGNOSIS — R1310 Dysphagia, unspecified: Secondary | ICD-10-CM | POA: Diagnosis not present

## 2020-01-24 DIAGNOSIS — Z8701 Personal history of pneumonia (recurrent): Secondary | ICD-10-CM | POA: Diagnosis not present

## 2020-01-24 DIAGNOSIS — G4736 Sleep related hypoventilation in conditions classified elsewhere: Secondary | ICD-10-CM | POA: Diagnosis not present

## 2020-01-24 DIAGNOSIS — R471 Dysarthria and anarthria: Secondary | ICD-10-CM | POA: Diagnosis not present

## 2020-01-24 DIAGNOSIS — G1221 Amyotrophic lateral sclerosis: Secondary | ICD-10-CM | POA: Diagnosis not present

## 2020-01-24 DIAGNOSIS — R634 Abnormal weight loss: Secondary | ICD-10-CM | POA: Diagnosis not present

## 2020-01-26 DIAGNOSIS — R471 Dysarthria and anarthria: Secondary | ICD-10-CM | POA: Diagnosis not present

## 2020-01-26 DIAGNOSIS — R634 Abnormal weight loss: Secondary | ICD-10-CM | POA: Diagnosis not present

## 2020-01-26 DIAGNOSIS — G4736 Sleep related hypoventilation in conditions classified elsewhere: Secondary | ICD-10-CM | POA: Diagnosis not present

## 2020-01-26 DIAGNOSIS — R1310 Dysphagia, unspecified: Secondary | ICD-10-CM | POA: Diagnosis not present

## 2020-01-26 DIAGNOSIS — Z8701 Personal history of pneumonia (recurrent): Secondary | ICD-10-CM | POA: Diagnosis not present

## 2020-01-26 DIAGNOSIS — G1221 Amyotrophic lateral sclerosis: Secondary | ICD-10-CM | POA: Diagnosis not present

## 2020-01-27 DIAGNOSIS — Z8701 Personal history of pneumonia (recurrent): Secondary | ICD-10-CM | POA: Diagnosis not present

## 2020-01-27 DIAGNOSIS — R634 Abnormal weight loss: Secondary | ICD-10-CM | POA: Diagnosis not present

## 2020-01-27 DIAGNOSIS — G1221 Amyotrophic lateral sclerosis: Secondary | ICD-10-CM | POA: Diagnosis not present

## 2020-01-27 DIAGNOSIS — R471 Dysarthria and anarthria: Secondary | ICD-10-CM | POA: Diagnosis not present

## 2020-01-27 DIAGNOSIS — G4736 Sleep related hypoventilation in conditions classified elsewhere: Secondary | ICD-10-CM | POA: Diagnosis not present

## 2020-01-27 DIAGNOSIS — R1310 Dysphagia, unspecified: Secondary | ICD-10-CM | POA: Diagnosis not present

## 2020-01-28 DIAGNOSIS — R471 Dysarthria and anarthria: Secondary | ICD-10-CM | POA: Diagnosis not present

## 2020-01-28 DIAGNOSIS — G4736 Sleep related hypoventilation in conditions classified elsewhere: Secondary | ICD-10-CM | POA: Diagnosis not present

## 2020-01-28 DIAGNOSIS — R634 Abnormal weight loss: Secondary | ICD-10-CM | POA: Diagnosis not present

## 2020-01-28 DIAGNOSIS — Z8701 Personal history of pneumonia (recurrent): Secondary | ICD-10-CM | POA: Diagnosis not present

## 2020-01-28 DIAGNOSIS — R1310 Dysphagia, unspecified: Secondary | ICD-10-CM | POA: Diagnosis not present

## 2020-01-28 DIAGNOSIS — G1221 Amyotrophic lateral sclerosis: Secondary | ICD-10-CM | POA: Diagnosis not present

## 2020-02-02 DIAGNOSIS — R634 Abnormal weight loss: Secondary | ICD-10-CM | POA: Diagnosis not present

## 2020-02-02 DIAGNOSIS — R471 Dysarthria and anarthria: Secondary | ICD-10-CM | POA: Diagnosis not present

## 2020-02-02 DIAGNOSIS — R1310 Dysphagia, unspecified: Secondary | ICD-10-CM | POA: Diagnosis not present

## 2020-02-02 DIAGNOSIS — G4736 Sleep related hypoventilation in conditions classified elsewhere: Secondary | ICD-10-CM | POA: Diagnosis not present

## 2020-02-02 DIAGNOSIS — Z8701 Personal history of pneumonia (recurrent): Secondary | ICD-10-CM | POA: Diagnosis not present

## 2020-02-02 DIAGNOSIS — G1221 Amyotrophic lateral sclerosis: Secondary | ICD-10-CM | POA: Diagnosis not present

## 2020-02-03 DIAGNOSIS — Z8701 Personal history of pneumonia (recurrent): Secondary | ICD-10-CM | POA: Diagnosis not present

## 2020-02-03 DIAGNOSIS — R471 Dysarthria and anarthria: Secondary | ICD-10-CM | POA: Diagnosis not present

## 2020-02-03 DIAGNOSIS — G1221 Amyotrophic lateral sclerosis: Secondary | ICD-10-CM | POA: Diagnosis not present

## 2020-02-03 DIAGNOSIS — R634 Abnormal weight loss: Secondary | ICD-10-CM | POA: Diagnosis not present

## 2020-02-03 DIAGNOSIS — G4736 Sleep related hypoventilation in conditions classified elsewhere: Secondary | ICD-10-CM | POA: Diagnosis not present

## 2020-02-03 DIAGNOSIS — R1310 Dysphagia, unspecified: Secondary | ICD-10-CM | POA: Diagnosis not present

## 2020-02-04 DIAGNOSIS — G4736 Sleep related hypoventilation in conditions classified elsewhere: Secondary | ICD-10-CM | POA: Diagnosis not present

## 2020-02-04 DIAGNOSIS — R1310 Dysphagia, unspecified: Secondary | ICD-10-CM | POA: Diagnosis not present

## 2020-02-04 DIAGNOSIS — G1221 Amyotrophic lateral sclerosis: Secondary | ICD-10-CM | POA: Diagnosis not present

## 2020-02-04 DIAGNOSIS — R634 Abnormal weight loss: Secondary | ICD-10-CM | POA: Diagnosis not present

## 2020-02-04 DIAGNOSIS — R471 Dysarthria and anarthria: Secondary | ICD-10-CM | POA: Diagnosis not present

## 2020-02-04 DIAGNOSIS — Z8701 Personal history of pneumonia (recurrent): Secondary | ICD-10-CM | POA: Diagnosis not present

## 2020-02-09 DIAGNOSIS — G1221 Amyotrophic lateral sclerosis: Secondary | ICD-10-CM | POA: Diagnosis not present

## 2020-02-09 DIAGNOSIS — R634 Abnormal weight loss: Secondary | ICD-10-CM | POA: Diagnosis not present

## 2020-02-09 DIAGNOSIS — G4736 Sleep related hypoventilation in conditions classified elsewhere: Secondary | ICD-10-CM | POA: Diagnosis not present

## 2020-02-09 DIAGNOSIS — R1310 Dysphagia, unspecified: Secondary | ICD-10-CM | POA: Diagnosis not present

## 2020-02-09 DIAGNOSIS — R471 Dysarthria and anarthria: Secondary | ICD-10-CM | POA: Diagnosis not present

## 2020-02-09 DIAGNOSIS — Z8701 Personal history of pneumonia (recurrent): Secondary | ICD-10-CM | POA: Diagnosis not present

## 2020-02-10 DIAGNOSIS — Z8701 Personal history of pneumonia (recurrent): Secondary | ICD-10-CM | POA: Diagnosis not present

## 2020-02-10 DIAGNOSIS — R634 Abnormal weight loss: Secondary | ICD-10-CM | POA: Diagnosis not present

## 2020-02-10 DIAGNOSIS — G1221 Amyotrophic lateral sclerosis: Secondary | ICD-10-CM | POA: Diagnosis not present

## 2020-02-10 DIAGNOSIS — R471 Dysarthria and anarthria: Secondary | ICD-10-CM | POA: Diagnosis not present

## 2020-02-10 DIAGNOSIS — R1310 Dysphagia, unspecified: Secondary | ICD-10-CM | POA: Diagnosis not present

## 2020-02-10 DIAGNOSIS — G4736 Sleep related hypoventilation in conditions classified elsewhere: Secondary | ICD-10-CM | POA: Diagnosis not present

## 2020-02-11 DIAGNOSIS — G4736 Sleep related hypoventilation in conditions classified elsewhere: Secondary | ICD-10-CM | POA: Diagnosis not present

## 2020-02-11 DIAGNOSIS — G1221 Amyotrophic lateral sclerosis: Secondary | ICD-10-CM | POA: Diagnosis not present

## 2020-02-11 DIAGNOSIS — R634 Abnormal weight loss: Secondary | ICD-10-CM | POA: Diagnosis not present

## 2020-02-11 DIAGNOSIS — Z9989 Dependence on other enabling machines and devices: Secondary | ICD-10-CM | POA: Diagnosis not present

## 2020-02-11 DIAGNOSIS — Z8701 Personal history of pneumonia (recurrent): Secondary | ICD-10-CM | POA: Diagnosis not present

## 2020-02-11 DIAGNOSIS — R1310 Dysphagia, unspecified: Secondary | ICD-10-CM | POA: Diagnosis not present

## 2020-02-11 DIAGNOSIS — R471 Dysarthria and anarthria: Secondary | ICD-10-CM | POA: Diagnosis not present

## 2020-02-11 DIAGNOSIS — R64 Cachexia: Secondary | ICD-10-CM | POA: Diagnosis not present

## 2020-02-11 DIAGNOSIS — E0590 Thyrotoxicosis, unspecified without thyrotoxic crisis or storm: Secondary | ICD-10-CM | POA: Diagnosis not present

## 2020-02-17 DIAGNOSIS — G1221 Amyotrophic lateral sclerosis: Secondary | ICD-10-CM | POA: Diagnosis not present

## 2020-02-17 DIAGNOSIS — Z8701 Personal history of pneumonia (recurrent): Secondary | ICD-10-CM | POA: Diagnosis not present

## 2020-02-17 DIAGNOSIS — G4736 Sleep related hypoventilation in conditions classified elsewhere: Secondary | ICD-10-CM | POA: Diagnosis not present

## 2020-02-17 DIAGNOSIS — R1310 Dysphagia, unspecified: Secondary | ICD-10-CM | POA: Diagnosis not present

## 2020-02-17 DIAGNOSIS — R471 Dysarthria and anarthria: Secondary | ICD-10-CM | POA: Diagnosis not present

## 2020-02-17 DIAGNOSIS — R634 Abnormal weight loss: Secondary | ICD-10-CM | POA: Diagnosis not present

## 2020-02-21 DIAGNOSIS — R1310 Dysphagia, unspecified: Secondary | ICD-10-CM | POA: Diagnosis not present

## 2020-02-21 DIAGNOSIS — Z8701 Personal history of pneumonia (recurrent): Secondary | ICD-10-CM | POA: Diagnosis not present

## 2020-02-21 DIAGNOSIS — R634 Abnormal weight loss: Secondary | ICD-10-CM | POA: Diagnosis not present

## 2020-02-21 DIAGNOSIS — R471 Dysarthria and anarthria: Secondary | ICD-10-CM | POA: Diagnosis not present

## 2020-02-21 DIAGNOSIS — G1221 Amyotrophic lateral sclerosis: Secondary | ICD-10-CM | POA: Diagnosis not present

## 2020-02-21 DIAGNOSIS — G4736 Sleep related hypoventilation in conditions classified elsewhere: Secondary | ICD-10-CM | POA: Diagnosis not present

## 2020-02-23 DIAGNOSIS — Z8701 Personal history of pneumonia (recurrent): Secondary | ICD-10-CM | POA: Diagnosis not present

## 2020-02-23 DIAGNOSIS — R1310 Dysphagia, unspecified: Secondary | ICD-10-CM | POA: Diagnosis not present

## 2020-02-23 DIAGNOSIS — R471 Dysarthria and anarthria: Secondary | ICD-10-CM | POA: Diagnosis not present

## 2020-02-23 DIAGNOSIS — R634 Abnormal weight loss: Secondary | ICD-10-CM | POA: Diagnosis not present

## 2020-02-23 DIAGNOSIS — G4736 Sleep related hypoventilation in conditions classified elsewhere: Secondary | ICD-10-CM | POA: Diagnosis not present

## 2020-02-23 DIAGNOSIS — G1221 Amyotrophic lateral sclerosis: Secondary | ICD-10-CM | POA: Diagnosis not present

## 2020-02-24 DIAGNOSIS — G4736 Sleep related hypoventilation in conditions classified elsewhere: Secondary | ICD-10-CM | POA: Diagnosis not present

## 2020-02-24 DIAGNOSIS — G1221 Amyotrophic lateral sclerosis: Secondary | ICD-10-CM | POA: Diagnosis not present

## 2020-02-24 DIAGNOSIS — R1310 Dysphagia, unspecified: Secondary | ICD-10-CM | POA: Diagnosis not present

## 2020-02-24 DIAGNOSIS — R471 Dysarthria and anarthria: Secondary | ICD-10-CM | POA: Diagnosis not present

## 2020-02-24 DIAGNOSIS — R634 Abnormal weight loss: Secondary | ICD-10-CM | POA: Diagnosis not present

## 2020-02-24 DIAGNOSIS — Z8701 Personal history of pneumonia (recurrent): Secondary | ICD-10-CM | POA: Diagnosis not present

## 2020-02-25 DIAGNOSIS — G1221 Amyotrophic lateral sclerosis: Secondary | ICD-10-CM | POA: Diagnosis not present

## 2020-02-25 DIAGNOSIS — Z8701 Personal history of pneumonia (recurrent): Secondary | ICD-10-CM | POA: Diagnosis not present

## 2020-02-25 DIAGNOSIS — R634 Abnormal weight loss: Secondary | ICD-10-CM | POA: Diagnosis not present

## 2020-02-25 DIAGNOSIS — R1310 Dysphagia, unspecified: Secondary | ICD-10-CM | POA: Diagnosis not present

## 2020-02-25 DIAGNOSIS — R471 Dysarthria and anarthria: Secondary | ICD-10-CM | POA: Diagnosis not present

## 2020-02-25 DIAGNOSIS — G4736 Sleep related hypoventilation in conditions classified elsewhere: Secondary | ICD-10-CM | POA: Diagnosis not present

## 2020-02-28 DIAGNOSIS — R471 Dysarthria and anarthria: Secondary | ICD-10-CM | POA: Diagnosis not present

## 2020-02-28 DIAGNOSIS — G1221 Amyotrophic lateral sclerosis: Secondary | ICD-10-CM | POA: Diagnosis not present

## 2020-02-28 DIAGNOSIS — G4736 Sleep related hypoventilation in conditions classified elsewhere: Secondary | ICD-10-CM | POA: Diagnosis not present

## 2020-02-28 DIAGNOSIS — Z8701 Personal history of pneumonia (recurrent): Secondary | ICD-10-CM | POA: Diagnosis not present

## 2020-02-28 DIAGNOSIS — R1310 Dysphagia, unspecified: Secondary | ICD-10-CM | POA: Diagnosis not present

## 2020-02-28 DIAGNOSIS — R634 Abnormal weight loss: Secondary | ICD-10-CM | POA: Diagnosis not present

## 2020-03-02 DIAGNOSIS — Z8701 Personal history of pneumonia (recurrent): Secondary | ICD-10-CM | POA: Diagnosis not present

## 2020-03-02 DIAGNOSIS — R1310 Dysphagia, unspecified: Secondary | ICD-10-CM | POA: Diagnosis not present

## 2020-03-02 DIAGNOSIS — G1221 Amyotrophic lateral sclerosis: Secondary | ICD-10-CM | POA: Diagnosis not present

## 2020-03-02 DIAGNOSIS — G4736 Sleep related hypoventilation in conditions classified elsewhere: Secondary | ICD-10-CM | POA: Diagnosis not present

## 2020-03-02 DIAGNOSIS — R471 Dysarthria and anarthria: Secondary | ICD-10-CM | POA: Diagnosis not present

## 2020-03-02 DIAGNOSIS — R634 Abnormal weight loss: Secondary | ICD-10-CM | POA: Diagnosis not present

## 2020-03-03 DIAGNOSIS — R1310 Dysphagia, unspecified: Secondary | ICD-10-CM | POA: Diagnosis not present

## 2020-03-03 DIAGNOSIS — G4736 Sleep related hypoventilation in conditions classified elsewhere: Secondary | ICD-10-CM | POA: Diagnosis not present

## 2020-03-03 DIAGNOSIS — G1221 Amyotrophic lateral sclerosis: Secondary | ICD-10-CM | POA: Diagnosis not present

## 2020-03-03 DIAGNOSIS — R634 Abnormal weight loss: Secondary | ICD-10-CM | POA: Diagnosis not present

## 2020-03-03 DIAGNOSIS — R471 Dysarthria and anarthria: Secondary | ICD-10-CM | POA: Diagnosis not present

## 2020-03-03 DIAGNOSIS — Z8701 Personal history of pneumonia (recurrent): Secondary | ICD-10-CM | POA: Diagnosis not present

## 2020-03-06 DIAGNOSIS — G4736 Sleep related hypoventilation in conditions classified elsewhere: Secondary | ICD-10-CM | POA: Diagnosis not present

## 2020-03-06 DIAGNOSIS — Z8701 Personal history of pneumonia (recurrent): Secondary | ICD-10-CM | POA: Diagnosis not present

## 2020-03-06 DIAGNOSIS — R471 Dysarthria and anarthria: Secondary | ICD-10-CM | POA: Diagnosis not present

## 2020-03-06 DIAGNOSIS — R634 Abnormal weight loss: Secondary | ICD-10-CM | POA: Diagnosis not present

## 2020-03-06 DIAGNOSIS — G1221 Amyotrophic lateral sclerosis: Secondary | ICD-10-CM | POA: Diagnosis not present

## 2020-03-06 DIAGNOSIS — R1310 Dysphagia, unspecified: Secondary | ICD-10-CM | POA: Diagnosis not present

## 2020-03-09 DIAGNOSIS — R634 Abnormal weight loss: Secondary | ICD-10-CM | POA: Diagnosis not present

## 2020-03-09 DIAGNOSIS — G4736 Sleep related hypoventilation in conditions classified elsewhere: Secondary | ICD-10-CM | POA: Diagnosis not present

## 2020-03-09 DIAGNOSIS — G1221 Amyotrophic lateral sclerosis: Secondary | ICD-10-CM | POA: Diagnosis not present

## 2020-03-09 DIAGNOSIS — R1310 Dysphagia, unspecified: Secondary | ICD-10-CM | POA: Diagnosis not present

## 2020-03-09 DIAGNOSIS — Z8701 Personal history of pneumonia (recurrent): Secondary | ICD-10-CM | POA: Diagnosis not present

## 2020-03-09 DIAGNOSIS — R471 Dysarthria and anarthria: Secondary | ICD-10-CM | POA: Diagnosis not present

## 2020-03-13 DIAGNOSIS — G1221 Amyotrophic lateral sclerosis: Secondary | ICD-10-CM | POA: Diagnosis not present

## 2020-03-13 DIAGNOSIS — G4736 Sleep related hypoventilation in conditions classified elsewhere: Secondary | ICD-10-CM | POA: Diagnosis not present

## 2020-03-13 DIAGNOSIS — R1310 Dysphagia, unspecified: Secondary | ICD-10-CM | POA: Diagnosis not present

## 2020-03-13 DIAGNOSIS — R64 Cachexia: Secondary | ICD-10-CM | POA: Diagnosis not present

## 2020-03-13 DIAGNOSIS — R634 Abnormal weight loss: Secondary | ICD-10-CM | POA: Diagnosis not present

## 2020-03-13 DIAGNOSIS — Z9989 Dependence on other enabling machines and devices: Secondary | ICD-10-CM | POA: Diagnosis not present

## 2020-03-13 DIAGNOSIS — R471 Dysarthria and anarthria: Secondary | ICD-10-CM | POA: Diagnosis not present

## 2020-03-13 DIAGNOSIS — E0590 Thyrotoxicosis, unspecified without thyrotoxic crisis or storm: Secondary | ICD-10-CM | POA: Diagnosis not present

## 2020-03-13 DIAGNOSIS — Z8701 Personal history of pneumonia (recurrent): Secondary | ICD-10-CM | POA: Diagnosis not present

## 2020-03-13 DIAGNOSIS — G3109 Other frontotemporal dementia: Secondary | ICD-10-CM | POA: Diagnosis not present

## 2020-03-13 DIAGNOSIS — F0280 Dementia in other diseases classified elsewhere without behavioral disturbance: Secondary | ICD-10-CM | POA: Diagnosis not present

## 2020-03-15 DIAGNOSIS — R471 Dysarthria and anarthria: Secondary | ICD-10-CM | POA: Diagnosis not present

## 2020-03-15 DIAGNOSIS — G1221 Amyotrophic lateral sclerosis: Secondary | ICD-10-CM | POA: Diagnosis not present

## 2020-03-15 DIAGNOSIS — R1310 Dysphagia, unspecified: Secondary | ICD-10-CM | POA: Diagnosis not present

## 2020-03-15 DIAGNOSIS — R634 Abnormal weight loss: Secondary | ICD-10-CM | POA: Diagnosis not present

## 2020-03-15 DIAGNOSIS — Z8701 Personal history of pneumonia (recurrent): Secondary | ICD-10-CM | POA: Diagnosis not present

## 2020-03-15 DIAGNOSIS — G4736 Sleep related hypoventilation in conditions classified elsewhere: Secondary | ICD-10-CM | POA: Diagnosis not present

## 2020-03-16 DIAGNOSIS — G4736 Sleep related hypoventilation in conditions classified elsewhere: Secondary | ICD-10-CM | POA: Diagnosis not present

## 2020-03-16 DIAGNOSIS — R1310 Dysphagia, unspecified: Secondary | ICD-10-CM | POA: Diagnosis not present

## 2020-03-16 DIAGNOSIS — R634 Abnormal weight loss: Secondary | ICD-10-CM | POA: Diagnosis not present

## 2020-03-16 DIAGNOSIS — G1221 Amyotrophic lateral sclerosis: Secondary | ICD-10-CM | POA: Diagnosis not present

## 2020-03-16 DIAGNOSIS — R471 Dysarthria and anarthria: Secondary | ICD-10-CM | POA: Diagnosis not present

## 2020-03-16 DIAGNOSIS — Z8701 Personal history of pneumonia (recurrent): Secondary | ICD-10-CM | POA: Diagnosis not present

## 2020-03-17 DIAGNOSIS — G4736 Sleep related hypoventilation in conditions classified elsewhere: Secondary | ICD-10-CM | POA: Diagnosis not present

## 2020-03-17 DIAGNOSIS — R471 Dysarthria and anarthria: Secondary | ICD-10-CM | POA: Diagnosis not present

## 2020-03-17 DIAGNOSIS — R1310 Dysphagia, unspecified: Secondary | ICD-10-CM | POA: Diagnosis not present

## 2020-03-17 DIAGNOSIS — G1221 Amyotrophic lateral sclerosis: Secondary | ICD-10-CM | POA: Diagnosis not present

## 2020-03-17 DIAGNOSIS — R634 Abnormal weight loss: Secondary | ICD-10-CM | POA: Diagnosis not present

## 2020-03-17 DIAGNOSIS — Z8701 Personal history of pneumonia (recurrent): Secondary | ICD-10-CM | POA: Diagnosis not present

## 2020-03-20 DIAGNOSIS — G1221 Amyotrophic lateral sclerosis: Secondary | ICD-10-CM | POA: Diagnosis not present

## 2020-03-20 DIAGNOSIS — R471 Dysarthria and anarthria: Secondary | ICD-10-CM | POA: Diagnosis not present

## 2020-03-20 DIAGNOSIS — R634 Abnormal weight loss: Secondary | ICD-10-CM | POA: Diagnosis not present

## 2020-03-20 DIAGNOSIS — R1310 Dysphagia, unspecified: Secondary | ICD-10-CM | POA: Diagnosis not present

## 2020-03-20 DIAGNOSIS — Z8701 Personal history of pneumonia (recurrent): Secondary | ICD-10-CM | POA: Diagnosis not present

## 2020-03-20 DIAGNOSIS — G4736 Sleep related hypoventilation in conditions classified elsewhere: Secondary | ICD-10-CM | POA: Diagnosis not present

## 2020-03-22 DIAGNOSIS — G1221 Amyotrophic lateral sclerosis: Secondary | ICD-10-CM | POA: Diagnosis not present

## 2020-03-22 DIAGNOSIS — R471 Dysarthria and anarthria: Secondary | ICD-10-CM | POA: Diagnosis not present

## 2020-03-22 DIAGNOSIS — Z8701 Personal history of pneumonia (recurrent): Secondary | ICD-10-CM | POA: Diagnosis not present

## 2020-03-22 DIAGNOSIS — R634 Abnormal weight loss: Secondary | ICD-10-CM | POA: Diagnosis not present

## 2020-03-22 DIAGNOSIS — R1310 Dysphagia, unspecified: Secondary | ICD-10-CM | POA: Diagnosis not present

## 2020-03-22 DIAGNOSIS — G4736 Sleep related hypoventilation in conditions classified elsewhere: Secondary | ICD-10-CM | POA: Diagnosis not present

## 2020-03-23 DIAGNOSIS — R471 Dysarthria and anarthria: Secondary | ICD-10-CM | POA: Diagnosis not present

## 2020-03-23 DIAGNOSIS — G1221 Amyotrophic lateral sclerosis: Secondary | ICD-10-CM | POA: Diagnosis not present

## 2020-03-23 DIAGNOSIS — R634 Abnormal weight loss: Secondary | ICD-10-CM | POA: Diagnosis not present

## 2020-03-23 DIAGNOSIS — G4736 Sleep related hypoventilation in conditions classified elsewhere: Secondary | ICD-10-CM | POA: Diagnosis not present

## 2020-03-23 DIAGNOSIS — R1310 Dysphagia, unspecified: Secondary | ICD-10-CM | POA: Diagnosis not present

## 2020-03-23 DIAGNOSIS — Z8701 Personal history of pneumonia (recurrent): Secondary | ICD-10-CM | POA: Diagnosis not present

## 2020-03-25 DIAGNOSIS — Z8701 Personal history of pneumonia (recurrent): Secondary | ICD-10-CM | POA: Diagnosis not present

## 2020-03-25 DIAGNOSIS — R634 Abnormal weight loss: Secondary | ICD-10-CM | POA: Diagnosis not present

## 2020-03-25 DIAGNOSIS — G4736 Sleep related hypoventilation in conditions classified elsewhere: Secondary | ICD-10-CM | POA: Diagnosis not present

## 2020-03-25 DIAGNOSIS — G1221 Amyotrophic lateral sclerosis: Secondary | ICD-10-CM | POA: Diagnosis not present

## 2020-03-25 DIAGNOSIS — R471 Dysarthria and anarthria: Secondary | ICD-10-CM | POA: Diagnosis not present

## 2020-03-25 DIAGNOSIS — R1310 Dysphagia, unspecified: Secondary | ICD-10-CM | POA: Diagnosis not present

## 2020-03-29 DIAGNOSIS — G1221 Amyotrophic lateral sclerosis: Secondary | ICD-10-CM | POA: Diagnosis not present

## 2020-03-29 DIAGNOSIS — Z8701 Personal history of pneumonia (recurrent): Secondary | ICD-10-CM | POA: Diagnosis not present

## 2020-03-29 DIAGNOSIS — R634 Abnormal weight loss: Secondary | ICD-10-CM | POA: Diagnosis not present

## 2020-03-29 DIAGNOSIS — G4736 Sleep related hypoventilation in conditions classified elsewhere: Secondary | ICD-10-CM | POA: Diagnosis not present

## 2020-03-29 DIAGNOSIS — R1310 Dysphagia, unspecified: Secondary | ICD-10-CM | POA: Diagnosis not present

## 2020-03-29 DIAGNOSIS — R471 Dysarthria and anarthria: Secondary | ICD-10-CM | POA: Diagnosis not present

## 2020-03-30 DIAGNOSIS — R1310 Dysphagia, unspecified: Secondary | ICD-10-CM | POA: Diagnosis not present

## 2020-03-30 DIAGNOSIS — G4736 Sleep related hypoventilation in conditions classified elsewhere: Secondary | ICD-10-CM | POA: Diagnosis not present

## 2020-03-30 DIAGNOSIS — R471 Dysarthria and anarthria: Secondary | ICD-10-CM | POA: Diagnosis not present

## 2020-03-30 DIAGNOSIS — Z8701 Personal history of pneumonia (recurrent): Secondary | ICD-10-CM | POA: Diagnosis not present

## 2020-03-30 DIAGNOSIS — G1221 Amyotrophic lateral sclerosis: Secondary | ICD-10-CM | POA: Diagnosis not present

## 2020-03-30 DIAGNOSIS — R634 Abnormal weight loss: Secondary | ICD-10-CM | POA: Diagnosis not present

## 2020-04-03 DIAGNOSIS — G1221 Amyotrophic lateral sclerosis: Secondary | ICD-10-CM | POA: Diagnosis not present

## 2020-04-03 DIAGNOSIS — G4736 Sleep related hypoventilation in conditions classified elsewhere: Secondary | ICD-10-CM | POA: Diagnosis not present

## 2020-04-03 DIAGNOSIS — R471 Dysarthria and anarthria: Secondary | ICD-10-CM | POA: Diagnosis not present

## 2020-04-03 DIAGNOSIS — R634 Abnormal weight loss: Secondary | ICD-10-CM | POA: Diagnosis not present

## 2020-04-03 DIAGNOSIS — R1310 Dysphagia, unspecified: Secondary | ICD-10-CM | POA: Diagnosis not present

## 2020-04-03 DIAGNOSIS — Z8701 Personal history of pneumonia (recurrent): Secondary | ICD-10-CM | POA: Diagnosis not present

## 2020-04-05 DIAGNOSIS — R634 Abnormal weight loss: Secondary | ICD-10-CM | POA: Diagnosis not present

## 2020-04-05 DIAGNOSIS — G4736 Sleep related hypoventilation in conditions classified elsewhere: Secondary | ICD-10-CM | POA: Diagnosis not present

## 2020-04-05 DIAGNOSIS — G1221 Amyotrophic lateral sclerosis: Secondary | ICD-10-CM | POA: Diagnosis not present

## 2020-04-05 DIAGNOSIS — R471 Dysarthria and anarthria: Secondary | ICD-10-CM | POA: Diagnosis not present

## 2020-04-05 DIAGNOSIS — R1310 Dysphagia, unspecified: Secondary | ICD-10-CM | POA: Diagnosis not present

## 2020-04-05 DIAGNOSIS — Z8701 Personal history of pneumonia (recurrent): Secondary | ICD-10-CM | POA: Diagnosis not present

## 2020-04-08 DIAGNOSIS — Z8701 Personal history of pneumonia (recurrent): Secondary | ICD-10-CM | POA: Diagnosis not present

## 2020-04-08 DIAGNOSIS — R1310 Dysphagia, unspecified: Secondary | ICD-10-CM | POA: Diagnosis not present

## 2020-04-08 DIAGNOSIS — G4736 Sleep related hypoventilation in conditions classified elsewhere: Secondary | ICD-10-CM | POA: Diagnosis not present

## 2020-04-08 DIAGNOSIS — G1221 Amyotrophic lateral sclerosis: Secondary | ICD-10-CM | POA: Diagnosis not present

## 2020-04-08 DIAGNOSIS — R471 Dysarthria and anarthria: Secondary | ICD-10-CM | POA: Diagnosis not present

## 2020-04-08 DIAGNOSIS — R634 Abnormal weight loss: Secondary | ICD-10-CM | POA: Diagnosis not present

## 2020-04-10 DIAGNOSIS — R1310 Dysphagia, unspecified: Secondary | ICD-10-CM | POA: Diagnosis not present

## 2020-04-10 DIAGNOSIS — G1221 Amyotrophic lateral sclerosis: Secondary | ICD-10-CM | POA: Diagnosis not present

## 2020-04-10 DIAGNOSIS — R471 Dysarthria and anarthria: Secondary | ICD-10-CM | POA: Diagnosis not present

## 2020-04-10 DIAGNOSIS — Z8701 Personal history of pneumonia (recurrent): Secondary | ICD-10-CM | POA: Diagnosis not present

## 2020-04-10 DIAGNOSIS — R634 Abnormal weight loss: Secondary | ICD-10-CM | POA: Diagnosis not present

## 2020-04-10 DIAGNOSIS — G4736 Sleep related hypoventilation in conditions classified elsewhere: Secondary | ICD-10-CM | POA: Diagnosis not present

## 2020-04-12 DIAGNOSIS — R471 Dysarthria and anarthria: Secondary | ICD-10-CM | POA: Diagnosis not present

## 2020-04-12 DIAGNOSIS — R64 Cachexia: Secondary | ICD-10-CM | POA: Diagnosis not present

## 2020-04-12 DIAGNOSIS — G3109 Other frontotemporal dementia: Secondary | ICD-10-CM | POA: Diagnosis not present

## 2020-04-12 DIAGNOSIS — E0590 Thyrotoxicosis, unspecified without thyrotoxic crisis or storm: Secondary | ICD-10-CM | POA: Diagnosis not present

## 2020-04-12 DIAGNOSIS — Z9989 Dependence on other enabling machines and devices: Secondary | ICD-10-CM | POA: Diagnosis not present

## 2020-04-12 DIAGNOSIS — G4736 Sleep related hypoventilation in conditions classified elsewhere: Secondary | ICD-10-CM | POA: Diagnosis not present

## 2020-04-12 DIAGNOSIS — R1310 Dysphagia, unspecified: Secondary | ICD-10-CM | POA: Diagnosis not present

## 2020-04-12 DIAGNOSIS — Z8701 Personal history of pneumonia (recurrent): Secondary | ICD-10-CM | POA: Diagnosis not present

## 2020-04-12 DIAGNOSIS — R634 Abnormal weight loss: Secondary | ICD-10-CM | POA: Diagnosis not present

## 2020-04-12 DIAGNOSIS — G1221 Amyotrophic lateral sclerosis: Secondary | ICD-10-CM | POA: Diagnosis not present

## 2020-04-12 DIAGNOSIS — F0280 Dementia in other diseases classified elsewhere without behavioral disturbance: Secondary | ICD-10-CM | POA: Diagnosis not present

## 2020-04-14 DIAGNOSIS — R1310 Dysphagia, unspecified: Secondary | ICD-10-CM | POA: Diagnosis not present

## 2020-04-14 DIAGNOSIS — R471 Dysarthria and anarthria: Secondary | ICD-10-CM | POA: Diagnosis not present

## 2020-04-14 DIAGNOSIS — G4736 Sleep related hypoventilation in conditions classified elsewhere: Secondary | ICD-10-CM | POA: Diagnosis not present

## 2020-04-14 DIAGNOSIS — Z8701 Personal history of pneumonia (recurrent): Secondary | ICD-10-CM | POA: Diagnosis not present

## 2020-04-14 DIAGNOSIS — G1221 Amyotrophic lateral sclerosis: Secondary | ICD-10-CM | POA: Diagnosis not present

## 2020-04-14 DIAGNOSIS — R634 Abnormal weight loss: Secondary | ICD-10-CM | POA: Diagnosis not present

## 2020-04-17 DIAGNOSIS — R634 Abnormal weight loss: Secondary | ICD-10-CM | POA: Diagnosis not present

## 2020-04-17 DIAGNOSIS — R471 Dysarthria and anarthria: Secondary | ICD-10-CM | POA: Diagnosis not present

## 2020-04-17 DIAGNOSIS — G4736 Sleep related hypoventilation in conditions classified elsewhere: Secondary | ICD-10-CM | POA: Diagnosis not present

## 2020-04-17 DIAGNOSIS — Z8701 Personal history of pneumonia (recurrent): Secondary | ICD-10-CM | POA: Diagnosis not present

## 2020-04-17 DIAGNOSIS — G1221 Amyotrophic lateral sclerosis: Secondary | ICD-10-CM | POA: Diagnosis not present

## 2020-04-17 DIAGNOSIS — R1310 Dysphagia, unspecified: Secondary | ICD-10-CM | POA: Diagnosis not present

## 2020-04-20 DIAGNOSIS — R1310 Dysphagia, unspecified: Secondary | ICD-10-CM | POA: Diagnosis not present

## 2020-04-20 DIAGNOSIS — G4736 Sleep related hypoventilation in conditions classified elsewhere: Secondary | ICD-10-CM | POA: Diagnosis not present

## 2020-04-20 DIAGNOSIS — R634 Abnormal weight loss: Secondary | ICD-10-CM | POA: Diagnosis not present

## 2020-04-20 DIAGNOSIS — R471 Dysarthria and anarthria: Secondary | ICD-10-CM | POA: Diagnosis not present

## 2020-04-20 DIAGNOSIS — G1221 Amyotrophic lateral sclerosis: Secondary | ICD-10-CM | POA: Diagnosis not present

## 2020-04-20 DIAGNOSIS — Z8701 Personal history of pneumonia (recurrent): Secondary | ICD-10-CM | POA: Diagnosis not present

## 2020-04-21 DIAGNOSIS — R471 Dysarthria and anarthria: Secondary | ICD-10-CM | POA: Diagnosis not present

## 2020-04-21 DIAGNOSIS — G4736 Sleep related hypoventilation in conditions classified elsewhere: Secondary | ICD-10-CM | POA: Diagnosis not present

## 2020-04-21 DIAGNOSIS — R1310 Dysphagia, unspecified: Secondary | ICD-10-CM | POA: Diagnosis not present

## 2020-04-21 DIAGNOSIS — G1221 Amyotrophic lateral sclerosis: Secondary | ICD-10-CM | POA: Diagnosis not present

## 2020-04-21 DIAGNOSIS — Z8701 Personal history of pneumonia (recurrent): Secondary | ICD-10-CM | POA: Diagnosis not present

## 2020-04-21 DIAGNOSIS — R634 Abnormal weight loss: Secondary | ICD-10-CM | POA: Diagnosis not present

## 2020-04-24 DIAGNOSIS — R634 Abnormal weight loss: Secondary | ICD-10-CM | POA: Diagnosis not present

## 2020-04-24 DIAGNOSIS — G1221 Amyotrophic lateral sclerosis: Secondary | ICD-10-CM | POA: Diagnosis not present

## 2020-04-24 DIAGNOSIS — G4736 Sleep related hypoventilation in conditions classified elsewhere: Secondary | ICD-10-CM | POA: Diagnosis not present

## 2020-04-24 DIAGNOSIS — Z8701 Personal history of pneumonia (recurrent): Secondary | ICD-10-CM | POA: Diagnosis not present

## 2020-04-24 DIAGNOSIS — R471 Dysarthria and anarthria: Secondary | ICD-10-CM | POA: Diagnosis not present

## 2020-04-24 DIAGNOSIS — R1310 Dysphagia, unspecified: Secondary | ICD-10-CM | POA: Diagnosis not present

## 2020-04-26 DIAGNOSIS — Z8701 Personal history of pneumonia (recurrent): Secondary | ICD-10-CM | POA: Diagnosis not present

## 2020-04-26 DIAGNOSIS — G1221 Amyotrophic lateral sclerosis: Secondary | ICD-10-CM | POA: Diagnosis not present

## 2020-04-26 DIAGNOSIS — R634 Abnormal weight loss: Secondary | ICD-10-CM | POA: Diagnosis not present

## 2020-04-26 DIAGNOSIS — R471 Dysarthria and anarthria: Secondary | ICD-10-CM | POA: Diagnosis not present

## 2020-04-26 DIAGNOSIS — R1310 Dysphagia, unspecified: Secondary | ICD-10-CM | POA: Diagnosis not present

## 2020-04-26 DIAGNOSIS — G4736 Sleep related hypoventilation in conditions classified elsewhere: Secondary | ICD-10-CM | POA: Diagnosis not present

## 2020-04-27 DIAGNOSIS — Z8701 Personal history of pneumonia (recurrent): Secondary | ICD-10-CM | POA: Diagnosis not present

## 2020-04-27 DIAGNOSIS — G4736 Sleep related hypoventilation in conditions classified elsewhere: Secondary | ICD-10-CM | POA: Diagnosis not present

## 2020-04-27 DIAGNOSIS — R471 Dysarthria and anarthria: Secondary | ICD-10-CM | POA: Diagnosis not present

## 2020-04-27 DIAGNOSIS — R634 Abnormal weight loss: Secondary | ICD-10-CM | POA: Diagnosis not present

## 2020-04-27 DIAGNOSIS — G1221 Amyotrophic lateral sclerosis: Secondary | ICD-10-CM | POA: Diagnosis not present

## 2020-04-27 DIAGNOSIS — R1310 Dysphagia, unspecified: Secondary | ICD-10-CM | POA: Diagnosis not present

## 2020-04-28 DIAGNOSIS — R634 Abnormal weight loss: Secondary | ICD-10-CM | POA: Diagnosis not present

## 2020-04-28 DIAGNOSIS — Z8701 Personal history of pneumonia (recurrent): Secondary | ICD-10-CM | POA: Diagnosis not present

## 2020-04-28 DIAGNOSIS — R471 Dysarthria and anarthria: Secondary | ICD-10-CM | POA: Diagnosis not present

## 2020-04-28 DIAGNOSIS — R1310 Dysphagia, unspecified: Secondary | ICD-10-CM | POA: Diagnosis not present

## 2020-04-28 DIAGNOSIS — G1221 Amyotrophic lateral sclerosis: Secondary | ICD-10-CM | POA: Diagnosis not present

## 2020-04-28 DIAGNOSIS — G4736 Sleep related hypoventilation in conditions classified elsewhere: Secondary | ICD-10-CM | POA: Diagnosis not present

## 2020-05-03 DIAGNOSIS — R1310 Dysphagia, unspecified: Secondary | ICD-10-CM | POA: Diagnosis not present

## 2020-05-03 DIAGNOSIS — R471 Dysarthria and anarthria: Secondary | ICD-10-CM | POA: Diagnosis not present

## 2020-05-03 DIAGNOSIS — G4736 Sleep related hypoventilation in conditions classified elsewhere: Secondary | ICD-10-CM | POA: Diagnosis not present

## 2020-05-03 DIAGNOSIS — Z8701 Personal history of pneumonia (recurrent): Secondary | ICD-10-CM | POA: Diagnosis not present

## 2020-05-03 DIAGNOSIS — R634 Abnormal weight loss: Secondary | ICD-10-CM | POA: Diagnosis not present

## 2020-05-03 DIAGNOSIS — G1221 Amyotrophic lateral sclerosis: Secondary | ICD-10-CM | POA: Diagnosis not present

## 2020-05-07 DIAGNOSIS — R634 Abnormal weight loss: Secondary | ICD-10-CM | POA: Diagnosis not present

## 2020-05-07 DIAGNOSIS — G4736 Sleep related hypoventilation in conditions classified elsewhere: Secondary | ICD-10-CM | POA: Diagnosis not present

## 2020-05-07 DIAGNOSIS — R1310 Dysphagia, unspecified: Secondary | ICD-10-CM | POA: Diagnosis not present

## 2020-05-07 DIAGNOSIS — R471 Dysarthria and anarthria: Secondary | ICD-10-CM | POA: Diagnosis not present

## 2020-05-07 DIAGNOSIS — G1221 Amyotrophic lateral sclerosis: Secondary | ICD-10-CM | POA: Diagnosis not present

## 2020-05-07 DIAGNOSIS — Z8701 Personal history of pneumonia (recurrent): Secondary | ICD-10-CM | POA: Diagnosis not present

## 2020-05-10 DIAGNOSIS — G4736 Sleep related hypoventilation in conditions classified elsewhere: Secondary | ICD-10-CM | POA: Diagnosis not present

## 2020-05-10 DIAGNOSIS — R634 Abnormal weight loss: Secondary | ICD-10-CM | POA: Diagnosis not present

## 2020-05-10 DIAGNOSIS — Z8701 Personal history of pneumonia (recurrent): Secondary | ICD-10-CM | POA: Diagnosis not present

## 2020-05-10 DIAGNOSIS — G1221 Amyotrophic lateral sclerosis: Secondary | ICD-10-CM | POA: Diagnosis not present

## 2020-05-10 DIAGNOSIS — R471 Dysarthria and anarthria: Secondary | ICD-10-CM | POA: Diagnosis not present

## 2020-05-10 DIAGNOSIS — R1310 Dysphagia, unspecified: Secondary | ICD-10-CM | POA: Diagnosis not present

## 2020-05-13 DIAGNOSIS — R471 Dysarthria and anarthria: Secondary | ICD-10-CM | POA: Diagnosis not present

## 2020-05-13 DIAGNOSIS — E0590 Thyrotoxicosis, unspecified without thyrotoxic crisis or storm: Secondary | ICD-10-CM | POA: Diagnosis not present

## 2020-05-13 DIAGNOSIS — R64 Cachexia: Secondary | ICD-10-CM | POA: Diagnosis not present

## 2020-05-13 DIAGNOSIS — G3109 Other frontotemporal dementia: Secondary | ICD-10-CM | POA: Diagnosis not present

## 2020-05-13 DIAGNOSIS — G1221 Amyotrophic lateral sclerosis: Secondary | ICD-10-CM | POA: Diagnosis not present

## 2020-05-13 DIAGNOSIS — F0280 Dementia in other diseases classified elsewhere without behavioral disturbance: Secondary | ICD-10-CM | POA: Diagnosis not present

## 2020-05-13 DIAGNOSIS — Z9989 Dependence on other enabling machines and devices: Secondary | ICD-10-CM | POA: Diagnosis not present

## 2020-05-13 DIAGNOSIS — R1310 Dysphagia, unspecified: Secondary | ICD-10-CM | POA: Diagnosis not present

## 2020-05-13 DIAGNOSIS — G4736 Sleep related hypoventilation in conditions classified elsewhere: Secondary | ICD-10-CM | POA: Diagnosis not present

## 2020-05-13 DIAGNOSIS — Z8701 Personal history of pneumonia (recurrent): Secondary | ICD-10-CM | POA: Diagnosis not present

## 2020-05-13 DIAGNOSIS — R634 Abnormal weight loss: Secondary | ICD-10-CM | POA: Diagnosis not present

## 2020-05-19 DIAGNOSIS — R634 Abnormal weight loss: Secondary | ICD-10-CM | POA: Diagnosis not present

## 2020-05-19 DIAGNOSIS — R1310 Dysphagia, unspecified: Secondary | ICD-10-CM | POA: Diagnosis not present

## 2020-05-19 DIAGNOSIS — G4736 Sleep related hypoventilation in conditions classified elsewhere: Secondary | ICD-10-CM | POA: Diagnosis not present

## 2020-05-19 DIAGNOSIS — G1221 Amyotrophic lateral sclerosis: Secondary | ICD-10-CM | POA: Diagnosis not present

## 2020-05-19 DIAGNOSIS — Z8701 Personal history of pneumonia (recurrent): Secondary | ICD-10-CM | POA: Diagnosis not present

## 2020-05-19 DIAGNOSIS — R471 Dysarthria and anarthria: Secondary | ICD-10-CM | POA: Diagnosis not present

## 2020-05-20 DIAGNOSIS — R471 Dysarthria and anarthria: Secondary | ICD-10-CM | POA: Diagnosis not present

## 2020-05-20 DIAGNOSIS — G4736 Sleep related hypoventilation in conditions classified elsewhere: Secondary | ICD-10-CM | POA: Diagnosis not present

## 2020-05-20 DIAGNOSIS — R634 Abnormal weight loss: Secondary | ICD-10-CM | POA: Diagnosis not present

## 2020-05-20 DIAGNOSIS — G1221 Amyotrophic lateral sclerosis: Secondary | ICD-10-CM | POA: Diagnosis not present

## 2020-05-20 DIAGNOSIS — Z8701 Personal history of pneumonia (recurrent): Secondary | ICD-10-CM | POA: Diagnosis not present

## 2020-05-20 DIAGNOSIS — R1310 Dysphagia, unspecified: Secondary | ICD-10-CM | POA: Diagnosis not present

## 2020-05-22 DIAGNOSIS — R1310 Dysphagia, unspecified: Secondary | ICD-10-CM | POA: Diagnosis not present

## 2020-05-22 DIAGNOSIS — R471 Dysarthria and anarthria: Secondary | ICD-10-CM | POA: Diagnosis not present

## 2020-05-22 DIAGNOSIS — G4736 Sleep related hypoventilation in conditions classified elsewhere: Secondary | ICD-10-CM | POA: Diagnosis not present

## 2020-05-22 DIAGNOSIS — Z8701 Personal history of pneumonia (recurrent): Secondary | ICD-10-CM | POA: Diagnosis not present

## 2020-05-22 DIAGNOSIS — G1221 Amyotrophic lateral sclerosis: Secondary | ICD-10-CM | POA: Diagnosis not present

## 2020-05-22 DIAGNOSIS — R634 Abnormal weight loss: Secondary | ICD-10-CM | POA: Diagnosis not present

## 2020-05-23 DIAGNOSIS — R471 Dysarthria and anarthria: Secondary | ICD-10-CM | POA: Diagnosis not present

## 2020-05-23 DIAGNOSIS — Z8701 Personal history of pneumonia (recurrent): Secondary | ICD-10-CM | POA: Diagnosis not present

## 2020-05-23 DIAGNOSIS — G1221 Amyotrophic lateral sclerosis: Secondary | ICD-10-CM | POA: Diagnosis not present

## 2020-05-23 DIAGNOSIS — R634 Abnormal weight loss: Secondary | ICD-10-CM | POA: Diagnosis not present

## 2020-05-23 DIAGNOSIS — R1310 Dysphagia, unspecified: Secondary | ICD-10-CM | POA: Diagnosis not present

## 2020-05-23 DIAGNOSIS — G4736 Sleep related hypoventilation in conditions classified elsewhere: Secondary | ICD-10-CM | POA: Diagnosis not present

## 2020-05-24 DIAGNOSIS — R1310 Dysphagia, unspecified: Secondary | ICD-10-CM | POA: Diagnosis not present

## 2020-05-24 DIAGNOSIS — G1221 Amyotrophic lateral sclerosis: Secondary | ICD-10-CM | POA: Diagnosis not present

## 2020-05-24 DIAGNOSIS — R634 Abnormal weight loss: Secondary | ICD-10-CM | POA: Diagnosis not present

## 2020-05-24 DIAGNOSIS — Z8701 Personal history of pneumonia (recurrent): Secondary | ICD-10-CM | POA: Diagnosis not present

## 2020-05-24 DIAGNOSIS — G4736 Sleep related hypoventilation in conditions classified elsewhere: Secondary | ICD-10-CM | POA: Diagnosis not present

## 2020-05-24 DIAGNOSIS — R471 Dysarthria and anarthria: Secondary | ICD-10-CM | POA: Diagnosis not present

## 2020-05-25 DIAGNOSIS — R471 Dysarthria and anarthria: Secondary | ICD-10-CM | POA: Diagnosis not present

## 2020-05-25 DIAGNOSIS — R634 Abnormal weight loss: Secondary | ICD-10-CM | POA: Diagnosis not present

## 2020-05-25 DIAGNOSIS — G4736 Sleep related hypoventilation in conditions classified elsewhere: Secondary | ICD-10-CM | POA: Diagnosis not present

## 2020-05-25 DIAGNOSIS — G1221 Amyotrophic lateral sclerosis: Secondary | ICD-10-CM | POA: Diagnosis not present

## 2020-05-25 DIAGNOSIS — R1310 Dysphagia, unspecified: Secondary | ICD-10-CM | POA: Diagnosis not present

## 2020-05-25 DIAGNOSIS — Z8701 Personal history of pneumonia (recurrent): Secondary | ICD-10-CM | POA: Diagnosis not present

## 2020-05-26 DIAGNOSIS — Z8701 Personal history of pneumonia (recurrent): Secondary | ICD-10-CM | POA: Diagnosis not present

## 2020-05-26 DIAGNOSIS — R634 Abnormal weight loss: Secondary | ICD-10-CM | POA: Diagnosis not present

## 2020-05-26 DIAGNOSIS — G4736 Sleep related hypoventilation in conditions classified elsewhere: Secondary | ICD-10-CM | POA: Diagnosis not present

## 2020-05-26 DIAGNOSIS — G1221 Amyotrophic lateral sclerosis: Secondary | ICD-10-CM | POA: Diagnosis not present

## 2020-05-26 DIAGNOSIS — R471 Dysarthria and anarthria: Secondary | ICD-10-CM | POA: Diagnosis not present

## 2020-05-26 DIAGNOSIS — R1310 Dysphagia, unspecified: Secondary | ICD-10-CM | POA: Diagnosis not present

## 2020-05-31 DIAGNOSIS — R634 Abnormal weight loss: Secondary | ICD-10-CM | POA: Diagnosis not present

## 2020-05-31 DIAGNOSIS — R471 Dysarthria and anarthria: Secondary | ICD-10-CM | POA: Diagnosis not present

## 2020-05-31 DIAGNOSIS — G1221 Amyotrophic lateral sclerosis: Secondary | ICD-10-CM | POA: Diagnosis not present

## 2020-05-31 DIAGNOSIS — R1310 Dysphagia, unspecified: Secondary | ICD-10-CM | POA: Diagnosis not present

## 2020-05-31 DIAGNOSIS — Z8701 Personal history of pneumonia (recurrent): Secondary | ICD-10-CM | POA: Diagnosis not present

## 2020-05-31 DIAGNOSIS — G4736 Sleep related hypoventilation in conditions classified elsewhere: Secondary | ICD-10-CM | POA: Diagnosis not present

## 2020-06-01 DIAGNOSIS — Z8701 Personal history of pneumonia (recurrent): Secondary | ICD-10-CM | POA: Diagnosis not present

## 2020-06-01 DIAGNOSIS — R1310 Dysphagia, unspecified: Secondary | ICD-10-CM | POA: Diagnosis not present

## 2020-06-01 DIAGNOSIS — G1221 Amyotrophic lateral sclerosis: Secondary | ICD-10-CM | POA: Diagnosis not present

## 2020-06-01 DIAGNOSIS — G4736 Sleep related hypoventilation in conditions classified elsewhere: Secondary | ICD-10-CM | POA: Diagnosis not present

## 2020-06-01 DIAGNOSIS — R471 Dysarthria and anarthria: Secondary | ICD-10-CM | POA: Diagnosis not present

## 2020-06-01 DIAGNOSIS — R634 Abnormal weight loss: Secondary | ICD-10-CM | POA: Diagnosis not present

## 2020-06-02 DIAGNOSIS — G1221 Amyotrophic lateral sclerosis: Secondary | ICD-10-CM | POA: Diagnosis not present

## 2020-06-02 DIAGNOSIS — G4736 Sleep related hypoventilation in conditions classified elsewhere: Secondary | ICD-10-CM | POA: Diagnosis not present

## 2020-06-02 DIAGNOSIS — R634 Abnormal weight loss: Secondary | ICD-10-CM | POA: Diagnosis not present

## 2020-06-02 DIAGNOSIS — R471 Dysarthria and anarthria: Secondary | ICD-10-CM | POA: Diagnosis not present

## 2020-06-02 DIAGNOSIS — R1310 Dysphagia, unspecified: Secondary | ICD-10-CM | POA: Diagnosis not present

## 2020-06-02 DIAGNOSIS — Z8701 Personal history of pneumonia (recurrent): Secondary | ICD-10-CM | POA: Diagnosis not present

## 2020-06-05 DIAGNOSIS — Z8701 Personal history of pneumonia (recurrent): Secondary | ICD-10-CM | POA: Diagnosis not present

## 2020-06-05 DIAGNOSIS — G1221 Amyotrophic lateral sclerosis: Secondary | ICD-10-CM | POA: Diagnosis not present

## 2020-06-05 DIAGNOSIS — R1310 Dysphagia, unspecified: Secondary | ICD-10-CM | POA: Diagnosis not present

## 2020-06-05 DIAGNOSIS — G4736 Sleep related hypoventilation in conditions classified elsewhere: Secondary | ICD-10-CM | POA: Diagnosis not present

## 2020-06-05 DIAGNOSIS — R471 Dysarthria and anarthria: Secondary | ICD-10-CM | POA: Diagnosis not present

## 2020-06-05 DIAGNOSIS — R634 Abnormal weight loss: Secondary | ICD-10-CM | POA: Diagnosis not present

## 2020-06-06 DIAGNOSIS — Z8701 Personal history of pneumonia (recurrent): Secondary | ICD-10-CM | POA: Diagnosis not present

## 2020-06-06 DIAGNOSIS — R471 Dysarthria and anarthria: Secondary | ICD-10-CM | POA: Diagnosis not present

## 2020-06-06 DIAGNOSIS — R634 Abnormal weight loss: Secondary | ICD-10-CM | POA: Diagnosis not present

## 2020-06-06 DIAGNOSIS — G4736 Sleep related hypoventilation in conditions classified elsewhere: Secondary | ICD-10-CM | POA: Diagnosis not present

## 2020-06-06 DIAGNOSIS — R1310 Dysphagia, unspecified: Secondary | ICD-10-CM | POA: Diagnosis not present

## 2020-06-06 DIAGNOSIS — G1221 Amyotrophic lateral sclerosis: Secondary | ICD-10-CM | POA: Diagnosis not present

## 2020-06-07 DIAGNOSIS — R471 Dysarthria and anarthria: Secondary | ICD-10-CM | POA: Diagnosis not present

## 2020-06-07 DIAGNOSIS — R1310 Dysphagia, unspecified: Secondary | ICD-10-CM | POA: Diagnosis not present

## 2020-06-07 DIAGNOSIS — G4736 Sleep related hypoventilation in conditions classified elsewhere: Secondary | ICD-10-CM | POA: Diagnosis not present

## 2020-06-07 DIAGNOSIS — Z8701 Personal history of pneumonia (recurrent): Secondary | ICD-10-CM | POA: Diagnosis not present

## 2020-06-07 DIAGNOSIS — R634 Abnormal weight loss: Secondary | ICD-10-CM | POA: Diagnosis not present

## 2020-06-07 DIAGNOSIS — G1221 Amyotrophic lateral sclerosis: Secondary | ICD-10-CM | POA: Diagnosis not present

## 2020-06-09 DIAGNOSIS — G1221 Amyotrophic lateral sclerosis: Secondary | ICD-10-CM | POA: Diagnosis not present

## 2020-06-09 DIAGNOSIS — R471 Dysarthria and anarthria: Secondary | ICD-10-CM | POA: Diagnosis not present

## 2020-06-09 DIAGNOSIS — R634 Abnormal weight loss: Secondary | ICD-10-CM | POA: Diagnosis not present

## 2020-06-09 DIAGNOSIS — Z8701 Personal history of pneumonia (recurrent): Secondary | ICD-10-CM | POA: Diagnosis not present

## 2020-06-09 DIAGNOSIS — G4736 Sleep related hypoventilation in conditions classified elsewhere: Secondary | ICD-10-CM | POA: Diagnosis not present

## 2020-06-09 DIAGNOSIS — R1310 Dysphagia, unspecified: Secondary | ICD-10-CM | POA: Diagnosis not present

## 2020-06-12 DIAGNOSIS — G4736 Sleep related hypoventilation in conditions classified elsewhere: Secondary | ICD-10-CM | POA: Diagnosis not present

## 2020-06-12 DIAGNOSIS — G1221 Amyotrophic lateral sclerosis: Secondary | ICD-10-CM | POA: Diagnosis not present

## 2020-06-12 DIAGNOSIS — R634 Abnormal weight loss: Secondary | ICD-10-CM | POA: Diagnosis not present

## 2020-06-12 DIAGNOSIS — Z8701 Personal history of pneumonia (recurrent): Secondary | ICD-10-CM | POA: Diagnosis not present

## 2020-06-12 DIAGNOSIS — R1310 Dysphagia, unspecified: Secondary | ICD-10-CM | POA: Diagnosis not present

## 2020-06-12 DIAGNOSIS — R471 Dysarthria and anarthria: Secondary | ICD-10-CM | POA: Diagnosis not present

## 2020-06-13 DIAGNOSIS — R1310 Dysphagia, unspecified: Secondary | ICD-10-CM | POA: Diagnosis not present

## 2020-06-13 DIAGNOSIS — Z8701 Personal history of pneumonia (recurrent): Secondary | ICD-10-CM | POA: Diagnosis not present

## 2020-06-13 DIAGNOSIS — R64 Cachexia: Secondary | ICD-10-CM | POA: Diagnosis not present

## 2020-06-13 DIAGNOSIS — F0280 Dementia in other diseases classified elsewhere without behavioral disturbance: Secondary | ICD-10-CM | POA: Diagnosis not present

## 2020-06-13 DIAGNOSIS — G3109 Other frontotemporal dementia: Secondary | ICD-10-CM | POA: Diagnosis not present

## 2020-06-13 DIAGNOSIS — G1221 Amyotrophic lateral sclerosis: Secondary | ICD-10-CM | POA: Diagnosis not present

## 2020-06-13 DIAGNOSIS — Z9989 Dependence on other enabling machines and devices: Secondary | ICD-10-CM | POA: Diagnosis not present

## 2020-06-13 DIAGNOSIS — R634 Abnormal weight loss: Secondary | ICD-10-CM | POA: Diagnosis not present

## 2020-06-13 DIAGNOSIS — R471 Dysarthria and anarthria: Secondary | ICD-10-CM | POA: Diagnosis not present

## 2020-06-13 DIAGNOSIS — E0590 Thyrotoxicosis, unspecified without thyrotoxic crisis or storm: Secondary | ICD-10-CM | POA: Diagnosis not present

## 2020-06-13 DIAGNOSIS — G4736 Sleep related hypoventilation in conditions classified elsewhere: Secondary | ICD-10-CM | POA: Diagnosis not present

## 2020-06-14 DIAGNOSIS — Z8701 Personal history of pneumonia (recurrent): Secondary | ICD-10-CM | POA: Diagnosis not present

## 2020-06-14 DIAGNOSIS — G4736 Sleep related hypoventilation in conditions classified elsewhere: Secondary | ICD-10-CM | POA: Diagnosis not present

## 2020-06-14 DIAGNOSIS — R634 Abnormal weight loss: Secondary | ICD-10-CM | POA: Diagnosis not present

## 2020-06-14 DIAGNOSIS — R1310 Dysphagia, unspecified: Secondary | ICD-10-CM | POA: Diagnosis not present

## 2020-06-14 DIAGNOSIS — G1221 Amyotrophic lateral sclerosis: Secondary | ICD-10-CM | POA: Diagnosis not present

## 2020-06-14 DIAGNOSIS — R471 Dysarthria and anarthria: Secondary | ICD-10-CM | POA: Diagnosis not present

## 2020-06-15 DIAGNOSIS — R471 Dysarthria and anarthria: Secondary | ICD-10-CM | POA: Diagnosis not present

## 2020-06-15 DIAGNOSIS — G1221 Amyotrophic lateral sclerosis: Secondary | ICD-10-CM | POA: Diagnosis not present

## 2020-06-15 DIAGNOSIS — R634 Abnormal weight loss: Secondary | ICD-10-CM | POA: Diagnosis not present

## 2020-06-15 DIAGNOSIS — G4736 Sleep related hypoventilation in conditions classified elsewhere: Secondary | ICD-10-CM | POA: Diagnosis not present

## 2020-06-15 DIAGNOSIS — R1310 Dysphagia, unspecified: Secondary | ICD-10-CM | POA: Diagnosis not present

## 2020-06-15 DIAGNOSIS — Z8701 Personal history of pneumonia (recurrent): Secondary | ICD-10-CM | POA: Diagnosis not present

## 2020-06-16 DIAGNOSIS — G4736 Sleep related hypoventilation in conditions classified elsewhere: Secondary | ICD-10-CM | POA: Diagnosis not present

## 2020-06-16 DIAGNOSIS — R634 Abnormal weight loss: Secondary | ICD-10-CM | POA: Diagnosis not present

## 2020-06-16 DIAGNOSIS — R1310 Dysphagia, unspecified: Secondary | ICD-10-CM | POA: Diagnosis not present

## 2020-06-16 DIAGNOSIS — G1221 Amyotrophic lateral sclerosis: Secondary | ICD-10-CM | POA: Diagnosis not present

## 2020-06-16 DIAGNOSIS — Z8701 Personal history of pneumonia (recurrent): Secondary | ICD-10-CM | POA: Diagnosis not present

## 2020-06-16 DIAGNOSIS — R471 Dysarthria and anarthria: Secondary | ICD-10-CM | POA: Diagnosis not present

## 2020-06-17 DIAGNOSIS — R1310 Dysphagia, unspecified: Secondary | ICD-10-CM | POA: Diagnosis not present

## 2020-06-17 DIAGNOSIS — G1221 Amyotrophic lateral sclerosis: Secondary | ICD-10-CM | POA: Diagnosis not present

## 2020-06-17 DIAGNOSIS — R634 Abnormal weight loss: Secondary | ICD-10-CM | POA: Diagnosis not present

## 2020-06-17 DIAGNOSIS — Z8701 Personal history of pneumonia (recurrent): Secondary | ICD-10-CM | POA: Diagnosis not present

## 2020-06-17 DIAGNOSIS — G4736 Sleep related hypoventilation in conditions classified elsewhere: Secondary | ICD-10-CM | POA: Diagnosis not present

## 2020-06-17 DIAGNOSIS — R471 Dysarthria and anarthria: Secondary | ICD-10-CM | POA: Diagnosis not present

## 2020-06-18 DIAGNOSIS — R1310 Dysphagia, unspecified: Secondary | ICD-10-CM | POA: Diagnosis not present

## 2020-06-18 DIAGNOSIS — G1221 Amyotrophic lateral sclerosis: Secondary | ICD-10-CM | POA: Diagnosis not present

## 2020-06-18 DIAGNOSIS — R471 Dysarthria and anarthria: Secondary | ICD-10-CM | POA: Diagnosis not present

## 2020-06-18 DIAGNOSIS — Z8701 Personal history of pneumonia (recurrent): Secondary | ICD-10-CM | POA: Diagnosis not present

## 2020-06-18 DIAGNOSIS — G4736 Sleep related hypoventilation in conditions classified elsewhere: Secondary | ICD-10-CM | POA: Diagnosis not present

## 2020-06-18 DIAGNOSIS — R634 Abnormal weight loss: Secondary | ICD-10-CM | POA: Diagnosis not present

## 2020-06-19 DIAGNOSIS — R1310 Dysphagia, unspecified: Secondary | ICD-10-CM | POA: Diagnosis not present

## 2020-06-19 DIAGNOSIS — R471 Dysarthria and anarthria: Secondary | ICD-10-CM | POA: Diagnosis not present

## 2020-06-19 DIAGNOSIS — G1221 Amyotrophic lateral sclerosis: Secondary | ICD-10-CM | POA: Diagnosis not present

## 2020-06-19 DIAGNOSIS — Z8701 Personal history of pneumonia (recurrent): Secondary | ICD-10-CM | POA: Diagnosis not present

## 2020-06-19 DIAGNOSIS — G4736 Sleep related hypoventilation in conditions classified elsewhere: Secondary | ICD-10-CM | POA: Diagnosis not present

## 2020-06-19 DIAGNOSIS — R634 Abnormal weight loss: Secondary | ICD-10-CM | POA: Diagnosis not present

## 2020-06-20 DIAGNOSIS — Z8701 Personal history of pneumonia (recurrent): Secondary | ICD-10-CM | POA: Diagnosis not present

## 2020-06-20 DIAGNOSIS — R634 Abnormal weight loss: Secondary | ICD-10-CM | POA: Diagnosis not present

## 2020-06-20 DIAGNOSIS — R1310 Dysphagia, unspecified: Secondary | ICD-10-CM | POA: Diagnosis not present

## 2020-06-20 DIAGNOSIS — G4736 Sleep related hypoventilation in conditions classified elsewhere: Secondary | ICD-10-CM | POA: Diagnosis not present

## 2020-06-20 DIAGNOSIS — G1221 Amyotrophic lateral sclerosis: Secondary | ICD-10-CM | POA: Diagnosis not present

## 2020-06-20 DIAGNOSIS — R471 Dysarthria and anarthria: Secondary | ICD-10-CM | POA: Diagnosis not present

## 2020-06-21 DIAGNOSIS — R1310 Dysphagia, unspecified: Secondary | ICD-10-CM | POA: Diagnosis not present

## 2020-06-21 DIAGNOSIS — G4736 Sleep related hypoventilation in conditions classified elsewhere: Secondary | ICD-10-CM | POA: Diagnosis not present

## 2020-06-21 DIAGNOSIS — G1221 Amyotrophic lateral sclerosis: Secondary | ICD-10-CM | POA: Diagnosis not present

## 2020-06-21 DIAGNOSIS — R471 Dysarthria and anarthria: Secondary | ICD-10-CM | POA: Diagnosis not present

## 2020-06-21 DIAGNOSIS — Z8701 Personal history of pneumonia (recurrent): Secondary | ICD-10-CM | POA: Diagnosis not present

## 2020-06-21 DIAGNOSIS — R634 Abnormal weight loss: Secondary | ICD-10-CM | POA: Diagnosis not present

## 2020-06-22 DIAGNOSIS — R1310 Dysphagia, unspecified: Secondary | ICD-10-CM | POA: Diagnosis not present

## 2020-06-22 DIAGNOSIS — G4736 Sleep related hypoventilation in conditions classified elsewhere: Secondary | ICD-10-CM | POA: Diagnosis not present

## 2020-06-22 DIAGNOSIS — R634 Abnormal weight loss: Secondary | ICD-10-CM | POA: Diagnosis not present

## 2020-06-22 DIAGNOSIS — Z8701 Personal history of pneumonia (recurrent): Secondary | ICD-10-CM | POA: Diagnosis not present

## 2020-06-22 DIAGNOSIS — G1221 Amyotrophic lateral sclerosis: Secondary | ICD-10-CM | POA: Diagnosis not present

## 2020-06-22 DIAGNOSIS — R471 Dysarthria and anarthria: Secondary | ICD-10-CM | POA: Diagnosis not present

## 2020-06-26 DIAGNOSIS — R1310 Dysphagia, unspecified: Secondary | ICD-10-CM | POA: Diagnosis not present

## 2020-06-26 DIAGNOSIS — Z8701 Personal history of pneumonia (recurrent): Secondary | ICD-10-CM | POA: Diagnosis not present

## 2020-06-26 DIAGNOSIS — G4736 Sleep related hypoventilation in conditions classified elsewhere: Secondary | ICD-10-CM | POA: Diagnosis not present

## 2020-06-26 DIAGNOSIS — G1221 Amyotrophic lateral sclerosis: Secondary | ICD-10-CM | POA: Diagnosis not present

## 2020-06-26 DIAGNOSIS — R471 Dysarthria and anarthria: Secondary | ICD-10-CM | POA: Diagnosis not present

## 2020-06-26 DIAGNOSIS — R634 Abnormal weight loss: Secondary | ICD-10-CM | POA: Diagnosis not present

## 2020-06-27 DIAGNOSIS — G1221 Amyotrophic lateral sclerosis: Secondary | ICD-10-CM | POA: Diagnosis not present

## 2020-06-27 DIAGNOSIS — R634 Abnormal weight loss: Secondary | ICD-10-CM | POA: Diagnosis not present

## 2020-06-27 DIAGNOSIS — R1310 Dysphagia, unspecified: Secondary | ICD-10-CM | POA: Diagnosis not present

## 2020-06-27 DIAGNOSIS — Z8701 Personal history of pneumonia (recurrent): Secondary | ICD-10-CM | POA: Diagnosis not present

## 2020-06-27 DIAGNOSIS — G4736 Sleep related hypoventilation in conditions classified elsewhere: Secondary | ICD-10-CM | POA: Diagnosis not present

## 2020-06-27 DIAGNOSIS — R471 Dysarthria and anarthria: Secondary | ICD-10-CM | POA: Diagnosis not present

## 2020-06-28 DIAGNOSIS — R1310 Dysphagia, unspecified: Secondary | ICD-10-CM | POA: Diagnosis not present

## 2020-06-28 DIAGNOSIS — R471 Dysarthria and anarthria: Secondary | ICD-10-CM | POA: Diagnosis not present

## 2020-06-28 DIAGNOSIS — G1221 Amyotrophic lateral sclerosis: Secondary | ICD-10-CM | POA: Diagnosis not present

## 2020-06-28 DIAGNOSIS — R634 Abnormal weight loss: Secondary | ICD-10-CM | POA: Diagnosis not present

## 2020-06-28 DIAGNOSIS — Z8701 Personal history of pneumonia (recurrent): Secondary | ICD-10-CM | POA: Diagnosis not present

## 2020-06-28 DIAGNOSIS — G4736 Sleep related hypoventilation in conditions classified elsewhere: Secondary | ICD-10-CM | POA: Diagnosis not present

## 2020-06-29 DIAGNOSIS — G1221 Amyotrophic lateral sclerosis: Secondary | ICD-10-CM | POA: Diagnosis not present

## 2020-06-29 DIAGNOSIS — Z8701 Personal history of pneumonia (recurrent): Secondary | ICD-10-CM | POA: Diagnosis not present

## 2020-06-29 DIAGNOSIS — R634 Abnormal weight loss: Secondary | ICD-10-CM | POA: Diagnosis not present

## 2020-06-29 DIAGNOSIS — G4736 Sleep related hypoventilation in conditions classified elsewhere: Secondary | ICD-10-CM | POA: Diagnosis not present

## 2020-06-29 DIAGNOSIS — R471 Dysarthria and anarthria: Secondary | ICD-10-CM | POA: Diagnosis not present

## 2020-06-29 DIAGNOSIS — R1310 Dysphagia, unspecified: Secondary | ICD-10-CM | POA: Diagnosis not present

## 2020-06-30 DIAGNOSIS — R471 Dysarthria and anarthria: Secondary | ICD-10-CM | POA: Diagnosis not present

## 2020-06-30 DIAGNOSIS — G1221 Amyotrophic lateral sclerosis: Secondary | ICD-10-CM | POA: Diagnosis not present

## 2020-06-30 DIAGNOSIS — R634 Abnormal weight loss: Secondary | ICD-10-CM | POA: Diagnosis not present

## 2020-06-30 DIAGNOSIS — G4736 Sleep related hypoventilation in conditions classified elsewhere: Secondary | ICD-10-CM | POA: Diagnosis not present

## 2020-06-30 DIAGNOSIS — Z8701 Personal history of pneumonia (recurrent): Secondary | ICD-10-CM | POA: Diagnosis not present

## 2020-06-30 DIAGNOSIS — R1310 Dysphagia, unspecified: Secondary | ICD-10-CM | POA: Diagnosis not present

## 2020-07-02 DIAGNOSIS — R471 Dysarthria and anarthria: Secondary | ICD-10-CM | POA: Diagnosis not present

## 2020-07-02 DIAGNOSIS — R1310 Dysphagia, unspecified: Secondary | ICD-10-CM | POA: Diagnosis not present

## 2020-07-02 DIAGNOSIS — G1221 Amyotrophic lateral sclerosis: Secondary | ICD-10-CM | POA: Diagnosis not present

## 2020-07-02 DIAGNOSIS — Z8701 Personal history of pneumonia (recurrent): Secondary | ICD-10-CM | POA: Diagnosis not present

## 2020-07-02 DIAGNOSIS — G4736 Sleep related hypoventilation in conditions classified elsewhere: Secondary | ICD-10-CM | POA: Diagnosis not present

## 2020-07-02 DIAGNOSIS — R634 Abnormal weight loss: Secondary | ICD-10-CM | POA: Diagnosis not present

## 2020-07-03 DIAGNOSIS — R1310 Dysphagia, unspecified: Secondary | ICD-10-CM | POA: Diagnosis not present

## 2020-07-03 DIAGNOSIS — G4736 Sleep related hypoventilation in conditions classified elsewhere: Secondary | ICD-10-CM | POA: Diagnosis not present

## 2020-07-03 DIAGNOSIS — R634 Abnormal weight loss: Secondary | ICD-10-CM | POA: Diagnosis not present

## 2020-07-03 DIAGNOSIS — R471 Dysarthria and anarthria: Secondary | ICD-10-CM | POA: Diagnosis not present

## 2020-07-03 DIAGNOSIS — G1221 Amyotrophic lateral sclerosis: Secondary | ICD-10-CM | POA: Diagnosis not present

## 2020-07-03 DIAGNOSIS — Z8701 Personal history of pneumonia (recurrent): Secondary | ICD-10-CM | POA: Diagnosis not present

## 2020-07-04 DIAGNOSIS — G1221 Amyotrophic lateral sclerosis: Secondary | ICD-10-CM | POA: Diagnosis not present

## 2020-07-04 DIAGNOSIS — Z8701 Personal history of pneumonia (recurrent): Secondary | ICD-10-CM | POA: Diagnosis not present

## 2020-07-04 DIAGNOSIS — R471 Dysarthria and anarthria: Secondary | ICD-10-CM | POA: Diagnosis not present

## 2020-07-04 DIAGNOSIS — G4736 Sleep related hypoventilation in conditions classified elsewhere: Secondary | ICD-10-CM | POA: Diagnosis not present

## 2020-07-04 DIAGNOSIS — R1310 Dysphagia, unspecified: Secondary | ICD-10-CM | POA: Diagnosis not present

## 2020-07-04 DIAGNOSIS — R634 Abnormal weight loss: Secondary | ICD-10-CM | POA: Diagnosis not present

## 2020-07-05 DIAGNOSIS — G4736 Sleep related hypoventilation in conditions classified elsewhere: Secondary | ICD-10-CM | POA: Diagnosis not present

## 2020-07-05 DIAGNOSIS — R1310 Dysphagia, unspecified: Secondary | ICD-10-CM | POA: Diagnosis not present

## 2020-07-05 DIAGNOSIS — R471 Dysarthria and anarthria: Secondary | ICD-10-CM | POA: Diagnosis not present

## 2020-07-05 DIAGNOSIS — R634 Abnormal weight loss: Secondary | ICD-10-CM | POA: Diagnosis not present

## 2020-07-05 DIAGNOSIS — G1221 Amyotrophic lateral sclerosis: Secondary | ICD-10-CM | POA: Diagnosis not present

## 2020-07-05 DIAGNOSIS — Z8701 Personal history of pneumonia (recurrent): Secondary | ICD-10-CM | POA: Diagnosis not present

## 2020-07-06 DIAGNOSIS — G1221 Amyotrophic lateral sclerosis: Secondary | ICD-10-CM | POA: Diagnosis not present

## 2020-07-06 DIAGNOSIS — R471 Dysarthria and anarthria: Secondary | ICD-10-CM | POA: Diagnosis not present

## 2020-07-06 DIAGNOSIS — R634 Abnormal weight loss: Secondary | ICD-10-CM | POA: Diagnosis not present

## 2020-07-06 DIAGNOSIS — Z8701 Personal history of pneumonia (recurrent): Secondary | ICD-10-CM | POA: Diagnosis not present

## 2020-07-06 DIAGNOSIS — G4736 Sleep related hypoventilation in conditions classified elsewhere: Secondary | ICD-10-CM | POA: Diagnosis not present

## 2020-07-06 DIAGNOSIS — R1310 Dysphagia, unspecified: Secondary | ICD-10-CM | POA: Diagnosis not present

## 2020-07-07 DIAGNOSIS — R1310 Dysphagia, unspecified: Secondary | ICD-10-CM | POA: Diagnosis not present

## 2020-07-07 DIAGNOSIS — R471 Dysarthria and anarthria: Secondary | ICD-10-CM | POA: Diagnosis not present

## 2020-07-07 DIAGNOSIS — G4736 Sleep related hypoventilation in conditions classified elsewhere: Secondary | ICD-10-CM | POA: Diagnosis not present

## 2020-07-07 DIAGNOSIS — Z8701 Personal history of pneumonia (recurrent): Secondary | ICD-10-CM | POA: Diagnosis not present

## 2020-07-07 DIAGNOSIS — R634 Abnormal weight loss: Secondary | ICD-10-CM | POA: Diagnosis not present

## 2020-07-07 DIAGNOSIS — G1221 Amyotrophic lateral sclerosis: Secondary | ICD-10-CM | POA: Diagnosis not present

## 2020-07-10 DIAGNOSIS — Z8701 Personal history of pneumonia (recurrent): Secondary | ICD-10-CM | POA: Diagnosis not present

## 2020-07-10 DIAGNOSIS — G4736 Sleep related hypoventilation in conditions classified elsewhere: Secondary | ICD-10-CM | POA: Diagnosis not present

## 2020-07-10 DIAGNOSIS — R634 Abnormal weight loss: Secondary | ICD-10-CM | POA: Diagnosis not present

## 2020-07-10 DIAGNOSIS — G1221 Amyotrophic lateral sclerosis: Secondary | ICD-10-CM | POA: Diagnosis not present

## 2020-07-10 DIAGNOSIS — R1310 Dysphagia, unspecified: Secondary | ICD-10-CM | POA: Diagnosis not present

## 2020-07-10 DIAGNOSIS — R471 Dysarthria and anarthria: Secondary | ICD-10-CM | POA: Diagnosis not present

## 2020-07-11 DEATH — deceased
